# Patient Record
Sex: Male | Born: 1975
Health system: Southern US, Community
[De-identification: ages and names within clinical notes are randomized; demographics above are authoritative.]

## PROBLEM LIST (undated history)

## (undated) DIAGNOSIS — N200 Calculus of kidney: Secondary | ICD-10-CM

## (undated) DIAGNOSIS — G4700 Insomnia, unspecified: Secondary | ICD-10-CM

## (undated) DIAGNOSIS — J3089 Other allergic rhinitis: Secondary | ICD-10-CM

## (undated) DIAGNOSIS — M1990 Unspecified osteoarthritis, unspecified site: Secondary | ICD-10-CM

## (undated) DIAGNOSIS — J302 Other seasonal allergic rhinitis: Secondary | ICD-10-CM

## (undated) DIAGNOSIS — R112 Nausea with vomiting, unspecified: Secondary | ICD-10-CM

## (undated) DIAGNOSIS — R5383 Other fatigue: Secondary | ICD-10-CM

## (undated) DIAGNOSIS — M25511 Pain in right shoulder: Secondary | ICD-10-CM

## (undated) DIAGNOSIS — E782 Mixed hyperlipidemia: Secondary | ICD-10-CM

## (undated) DIAGNOSIS — I10 Essential (primary) hypertension: Secondary | ICD-10-CM

## (undated) DIAGNOSIS — T4145XA Adverse effect of unspecified anesthetic, initial encounter: Secondary | ICD-10-CM

## (undated) DIAGNOSIS — N529 Male erectile dysfunction, unspecified: Secondary | ICD-10-CM

## (undated) DIAGNOSIS — E669 Obesity, unspecified: Secondary | ICD-10-CM

## (undated) DIAGNOSIS — M25512 Pain in left shoulder: Secondary | ICD-10-CM

## (undated) DIAGNOSIS — Z9889 Other specified postprocedural states: Secondary | ICD-10-CM

## (undated) DIAGNOSIS — T8859XA Other complications of anesthesia, initial encounter: Secondary | ICD-10-CM

## (undated) HISTORY — DX: Pain in left shoulder: M25.512

## (undated) HISTORY — PX: CHOLECYSTECTOMY, LAPAROSCOPIC: SHX56

## (undated) HISTORY — DX: Other allergic rhinitis: J30.89

## (undated) HISTORY — PX: CHOLECYSTECTOMY: SHX55

## (undated) HISTORY — DX: Obesity, unspecified: E66.9

## (undated) HISTORY — DX: Other seasonal allergic rhinitis: J30.2

## (undated) HISTORY — DX: Pain in right shoulder: M25.511

## (undated) HISTORY — DX: Male erectile dysfunction, unspecified: N52.9

## (undated) HISTORY — DX: Mixed hyperlipidemia: E78.2

## (undated) HISTORY — DX: Insomnia, unspecified: G47.00

## (undated) HISTORY — DX: Other fatigue: R53.83

## (undated) HISTORY — DX: Calculus of kidney: N20.0

---

## 2005-03-31 HISTORY — PX: CERVICAL LAMINECTOMY: SHX94

## 2005-08-06 ENCOUNTER — Emergency Department (HOSPITAL_COMMUNITY): Admission: EM | Admit: 2005-08-06 | Discharge: 2005-08-06 | Payer: Self-pay | Admitting: *Deleted

## 2005-08-08 ENCOUNTER — Encounter: Payer: Self-pay | Admitting: Interventional Radiology

## 2005-09-15 ENCOUNTER — Ambulatory Visit (HOSPITAL_COMMUNITY): Admission: RE | Admit: 2005-09-15 | Discharge: 2005-09-15 | Payer: Self-pay | Admitting: Interventional Radiology

## 2006-03-03 ENCOUNTER — Ambulatory Visit (HOSPITAL_COMMUNITY): Admission: RE | Admit: 2006-03-03 | Discharge: 2006-03-05 | Payer: Self-pay | Admitting: Orthopedic Surgery

## 2006-03-03 ENCOUNTER — Encounter (INDEPENDENT_AMBULATORY_CARE_PROVIDER_SITE_OTHER): Payer: Self-pay | Admitting: *Deleted

## 2008-09-11 ENCOUNTER — Ambulatory Visit: Payer: Self-pay | Admitting: Cardiology

## 2009-03-12 ENCOUNTER — Emergency Department (HOSPITAL_COMMUNITY): Admission: EM | Admit: 2009-03-12 | Discharge: 2009-03-12 | Payer: Self-pay | Admitting: Emergency Medicine

## 2009-07-15 ENCOUNTER — Emergency Department (HOSPITAL_BASED_OUTPATIENT_CLINIC_OR_DEPARTMENT_OTHER): Admission: EM | Admit: 2009-07-15 | Discharge: 2009-07-15 | Payer: Self-pay | Admitting: Emergency Medicine

## 2009-07-15 ENCOUNTER — Ambulatory Visit: Payer: Self-pay | Admitting: Radiology

## 2009-10-19 ENCOUNTER — Observation Stay (HOSPITAL_COMMUNITY): Admission: EM | Admit: 2009-10-19 | Discharge: 2009-10-21 | Payer: Self-pay | Admitting: Emergency Medicine

## 2009-12-15 ENCOUNTER — Emergency Department (HOSPITAL_COMMUNITY): Admission: EM | Admit: 2009-12-15 | Discharge: 2009-12-15 | Payer: Self-pay | Admitting: Emergency Medicine

## 2010-06-15 LAB — BASIC METABOLIC PANEL
BUN: 12 mg/dL (ref 6–23)
BUN: 16 mg/dL (ref 6–23)
BUN: 22 mg/dL (ref 6–23)
CO2: 26 mEq/L (ref 19–32)
Calcium: 8.6 mg/dL (ref 8.4–10.5)
Chloride: 101 mEq/L (ref 96–112)
Chloride: 102 mEq/L (ref 96–112)
Chloride: 96 mEq/L (ref 96–112)
Creatinine, Ser: 1.03 mg/dL (ref 0.4–1.5)
Creatinine, Ser: 1.05 mg/dL (ref 0.4–1.5)
GFR calc Af Amer: >60 mL/min (ref >60)
GFR calc Af Amer: >60 mL/min (ref >60)
GFR calc non Af Amer: >60 mL/min (ref >60)
Glucose, Bld: 120 mg/dL — ABNORMAL HIGH (ref 70–99)
Potassium: 4.2 mEq/L (ref 3.5–5.1)
Potassium: 4.5 mEq/L (ref 3.5–5.1)
Sodium: 135 mEq/L (ref 135–145)

## 2010-06-15 LAB — CARDIAC PANEL(CRET KIN+CKTOT+MB+TROPI)
CK, MB: 0.7 ng/mL (ref 0.3–4.0)
CK, MB: 0.8 ng/mL (ref 0.3–4.0)
Relative Index: INVALID (ref 0.0–2.5)
Relative Index: INVALID (ref 0.0–2.5)
Troponin I: 0.01 ng/mL (ref 0.00–0.06)
Troponin I: 0.01 ng/mL (ref 0.00–0.06)
Troponin I: <0.01 ng/mL (ref 0.00–0.06)

## 2010-06-15 LAB — POCT CARDIAC MARKERS
CKMB, poc: <1 ng/mL — ABNORMAL LOW (ref 1.0–8.0)
Myoglobin, poc: 65.1 ng/mL (ref 12–200)
Myoglobin, poc: 78.7 ng/mL (ref 12–200)

## 2010-06-15 LAB — LIPID PANEL
HDL: 38 mg/dL — ABNORMAL LOW (ref >39)
LDL Cholesterol: 145 mg/dL — ABNORMAL HIGH (ref 0–99)
Triglycerides: 221 mg/dL — ABNORMAL HIGH (ref <150)
VLDL: 44 mg/dL — ABNORMAL HIGH (ref 0–40)

## 2010-06-15 LAB — MRSA PCR SCREENING: MRSA by PCR: NEGATIVE

## 2010-06-15 LAB — CBC
HCT: 41.1 % (ref 39.0–52.0)
HCT: 45.8 % (ref 39.0–52.0)
Hemoglobin: 14.2 g/dL (ref 13.0–17.0)
MCV: 89.4 fL (ref 78.0–100.0)
MCV: 89.7 fL (ref 78.0–100.0)
RBC: 4.6 MIL/uL (ref 4.22–5.81)
RDW: 11.9 % (ref 11.5–15.5)
WBC: 11.2 10*3/uL — ABNORMAL HIGH (ref 4.0–10.5)
WBC: 9 10*3/uL (ref 4.0–10.5)

## 2010-06-15 LAB — TSH: TSH: 1.879 u[IU]/mL (ref 0.350–4.500)

## 2010-06-15 LAB — DIFFERENTIAL
Basophils Relative: 1 % (ref 0–1)
Lymphs Abs: 2.8 10*3/uL (ref 0.7–4.0)
Monocytes Relative: 7 % (ref 3–12)
Neutro Abs: 7.5 10*3/uL (ref 1.7–7.7)
Neutrophils Relative %: 67 % (ref 43–77)

## 2010-06-15 LAB — RAPID URINE DRUG SCREEN, HOSP PERFORMED
Amphetamines: NOT DETECTED
Benzodiazepines: NOT DETECTED
Opiates: POSITIVE — AB
Tetrahydrocannabinol: NOT DETECTED

## 2010-06-15 LAB — CK TOTAL AND CKMB (NOT AT ARMC): CK, MB: 0.7 ng/mL (ref 0.3–4.0)

## 2010-06-18 LAB — URINALYSIS, ROUTINE W REFLEX MICROSCOPIC
Bilirubin Urine: NEGATIVE
Glucose, UA: NEGATIVE mg/dL
Ketones, ur: NEGATIVE mg/dL
Protein, ur: NEGATIVE mg/dL
pH: 5.5 (ref 5.0–8.0)

## 2010-06-18 LAB — CBC
HCT: 43.6 % (ref 39.0–52.0)
Hemoglobin: 14.6 g/dL (ref 13.0–17.0)
MCHC: 33.3 g/dL (ref 30.0–36.0)
RDW: 11.6 % (ref 11.5–15.5)

## 2010-06-18 LAB — DIFFERENTIAL
Basophils Relative: 1 % (ref 0–1)
Lymphocytes Relative: 25 % (ref 12–46)
Lymphs Abs: 1.8 10*3/uL (ref 0.7–4.0)
Monocytes Relative: 11 % (ref 3–12)
Neutro Abs: 4 10*3/uL (ref 1.7–7.7)
Neutrophils Relative %: 59 % (ref 43–77)

## 2010-06-18 LAB — COMPREHENSIVE METABOLIC PANEL
BUN: 14 mg/dL (ref 6–23)
Calcium: 9.2 mg/dL (ref 8.4–10.5)
Creatinine, Ser: 0.9 mg/dL (ref 0.4–1.5)
Glucose, Bld: 134 mg/dL — ABNORMAL HIGH (ref 70–99)
Total Protein: 7.6 g/dL (ref 6.0–8.3)

## 2010-07-02 LAB — BASIC METABOLIC PANEL
CO2: 23 mEq/L (ref 19–32)
Calcium: 8.9 mg/dL (ref 8.4–10.5)
GFR calc Af Amer: >60 mL/min (ref >60)
Sodium: 137 mEq/L (ref 135–145)

## 2010-07-02 LAB — POCT CARDIAC MARKERS
CKMB, poc: <1 ng/mL — ABNORMAL LOW (ref 1.0–8.0)
CKMB, poc: <1 ng/mL — ABNORMAL LOW (ref 1.0–8.0)
Troponin i, poc: <0.05 ng/mL (ref 0.00–0.09)

## 2010-07-02 LAB — POCT I-STAT, CHEM 8
Chloride: 109 mEq/L (ref 96–112)
Glucose, Bld: 109 mg/dL — ABNORMAL HIGH (ref 70–99)
HCT: 45 % (ref 39.0–52.0)
Potassium: 4 mEq/L (ref 3.5–5.1)

## 2010-07-21 ENCOUNTER — Emergency Department (HOSPITAL_COMMUNITY): Payer: Managed Care, Other (non HMO)

## 2010-07-21 ENCOUNTER — Emergency Department (HOSPITAL_COMMUNITY)
Admission: EM | Admit: 2010-07-21 | Discharge: 2010-07-21 | Disposition: A | Discharge status: Home / Self Care | Payer: Managed Care, Other (non HMO) | Attending: Emergency Medicine | Admitting: Emergency Medicine

## 2010-07-21 DIAGNOSIS — R109 Unspecified abdominal pain: Secondary | ICD-10-CM | POA: Insufficient documentation

## 2010-07-21 DIAGNOSIS — I10 Essential (primary) hypertension: Secondary | ICD-10-CM | POA: Insufficient documentation

## 2010-07-21 DIAGNOSIS — Z87442 Personal history of urinary calculi: Secondary | ICD-10-CM | POA: Insufficient documentation

## 2010-07-21 DIAGNOSIS — Z79899 Other long term (current) drug therapy: Secondary | ICD-10-CM | POA: Insufficient documentation

## 2010-07-21 LAB — URINALYSIS, ROUTINE W REFLEX MICROSCOPIC
Bilirubin Urine: NEGATIVE
Glucose, UA: 500 mg/dL — AB
Hgb urine dipstick: NEGATIVE
Ketones, ur: NEGATIVE mg/dL
Leukocytes, UA: NEGATIVE
Nitrite: NEGATIVE
Specific Gravity, Urine: >1.03 — ABNORMAL HIGH (ref 1.005–1.030)
Urobilinogen, UA: 0.2 mg/dL (ref 0.0–1.0)
pH: 5.5 (ref 5.0–8.0)

## 2010-07-21 LAB — URINE MICROSCOPIC-ADD ON

## 2010-07-21 LAB — GLUCOSE, CAPILLARY: Glucose-Capillary: 151 mg/dL — ABNORMAL HIGH (ref 70–99)

## 2010-08-12 ENCOUNTER — Emergency Department (HOSPITAL_COMMUNITY)
Admission: EM | Admit: 2010-08-12 | Discharge: 2010-08-12 | Disposition: A | Discharge status: Home / Self Care | Payer: Managed Care, Other (non HMO) | Attending: Emergency Medicine | Admitting: Emergency Medicine

## 2010-08-12 ENCOUNTER — Emergency Department (HOSPITAL_COMMUNITY): Payer: Managed Care, Other (non HMO)

## 2010-08-12 DIAGNOSIS — R319 Hematuria, unspecified: Secondary | ICD-10-CM | POA: Insufficient documentation

## 2010-08-12 DIAGNOSIS — I10 Essential (primary) hypertension: Secondary | ICD-10-CM | POA: Insufficient documentation

## 2010-08-12 DIAGNOSIS — M549 Dorsalgia, unspecified: Secondary | ICD-10-CM | POA: Insufficient documentation

## 2010-08-12 DIAGNOSIS — Z87442 Personal history of urinary calculi: Secondary | ICD-10-CM | POA: Insufficient documentation

## 2010-08-12 DIAGNOSIS — R300 Dysuria: Secondary | ICD-10-CM | POA: Insufficient documentation

## 2010-08-12 DIAGNOSIS — R110 Nausea: Secondary | ICD-10-CM | POA: Insufficient documentation

## 2010-08-12 DIAGNOSIS — R109 Unspecified abdominal pain: Secondary | ICD-10-CM | POA: Insufficient documentation

## 2010-08-12 LAB — BASIC METABOLIC PANEL
CO2: 28 mEq/L (ref 19–32)
Calcium: 9.7 mg/dL (ref 8.4–10.5)
Creatinine, Ser: 0.9 mg/dL (ref 0.4–1.5)
GFR calc Af Amer: >60 mL/min (ref >60)
GFR calc non Af Amer: >60 mL/min (ref >60)

## 2010-08-12 LAB — CBC
MCH: 29.1 pg (ref 26.0–34.0)
MCHC: 33.5 g/dL (ref 30.0–36.0)
Platelets: 202 10*3/uL (ref 150–400)
RDW: 12.3 % (ref 11.5–15.5)

## 2010-08-12 LAB — DIFFERENTIAL
Basophils Relative: 1 % (ref 0–1)
Eosinophils Absolute: 0.1 10*3/uL (ref 0.0–0.7)
Eosinophils Relative: 2 % (ref 0–5)
Monocytes Absolute: 0.5 10*3/uL (ref 0.1–1.0)
Monocytes Relative: 6 % (ref 3–12)

## 2010-08-12 LAB — URINALYSIS, ROUTINE W REFLEX MICROSCOPIC
Glucose, UA: 250 mg/dL — AB
Ketones, ur: NEGATIVE mg/dL
Protein, ur: NEGATIVE mg/dL

## 2010-08-15 LAB — URINE CULTURE
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
## 2010-08-16 NOTE — Consult Note (Signed)
NAME:  Michael Gallagher, Michael Gallagher NO.:  [id number]   MEDICAL RECORD NO.:  [id number]          PATIENT TYPE:  OUT   LOCATION:  XRAY                         FACILITY:  [facility]   PHYSICIAN:  Sanjeev K. Deveshwar, M.D.DATE OF BIRTH:  25-Jul-1975   DATE OF CONSULTATION:  08/08/2005  DATE OF DISCHARGE:                                   CONSULTATION   BRIEF ADDENDUM:  Further history on this patient reveals that he is a weight  lifter for recreation.  It has been recommended that he not participate  further  in any type of weight lifting and that he avoid lifting more than  25 pounds until this aneurysm is further evaluated and treated.  It was also  recommended that he keep his blood pressure well controlled to further  decrease his risk of aneurysm rupture.      Delton See, P.A.    ______________________________  Grandville Silos. Corliss Skains, M.D.    DR/MEDQ  D:  08/08/2005  T:  08/09/2005  Job:  [id number]

## 2010-08-16 NOTE — Op Note (Signed)
NAME:  Michael Gallagher, Michael Gallagher NO.:  [id number]   MEDICAL RECORD NO.:  [id number]          PATIENT TYPE:  OIB   LOCATION:  3009                         FACILITY:  [facility]   PHYSICIAN:  Nelda Severe, MD      DATE OF BIRTH:  1975-05-07   DATE OF PROCEDURE:  03/03/2006  DATE OF DISCHARGE:                               OPERATIVE REPORT   SURGEON:  Dr. Theadore Nan.   ASSISTANT:  Lianne Cure, PA-C.   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   OPERATIVE PROCEDURE:  Anterior C5-6 decompression and fusion with  Synthes cage and beta tricalcium phosphate and aspiration bone marrow  the C5 vertebral body, application of titanium screw and plate  (Synthes).   OPERATIVE NOTE:  The patient was placed under general endotracheal  anesthesia.  A gram of vancomycin had been infused intravenously.  Sequential compression devices were placed on both lower extremities.  The neck was extended and head supported on a [place]field horseshoe type  head holder.   The anterior cervical spine was prepped with DuraPrep and draped in  square fashion.  The drapes were secured with Ioban.   A transverse incision was made from midline to the medial border the  sternocleidomastoid on the left side at what was estimated to be the C5-  6 level.  Platysma was cut in line with the incision.  There was a good  deal of fat deep to the platysma and eventually the anterior border of  the sternocleidomastoid was identified and the interval entering the  deep cervical fascia identified.  Blunt dissection was carried out down  to the anterior aspect of the spinal column.  The omohyoid muscle was  identified and retracted distally and medially.   We identified one disk space higher than that which was anticipated to  be the correct disk space so that the marker would be more likely to  show up on x-ray.  Radiograph showed the marker (a  bent spinal needle,  at the C4-5 level, hence identifying the C5 vertebra and the C5-6 disk  space by counting down one level).   We mobilized the longus coli muscles bilaterally from the C4-5 disk to  the C6-7 disk.  Shadow Line retractor was placed with the deepest  available blade placed to the right side (60 mm) and a 50 mm blade  laterally.  Both plates were placed deep to the longus coli muscle and  the muscle retracted bilaterally.  We then placed distraction pins in  the body of C5 and C6 and attached the Synthes distracting device and  distracted the disk space several millimeters.  We then made a  rectangular cut in the anterior disk and then separated as much of the  nucleus as possible from the endplates using small elevators.  The disk  was then removed about 75% in one piece and then straight, forward  angled, back angled curettes used to remove disk material back as far as  the posterior margin of the C6 vertebral body and C5 vertebral body  above.  A 1-mm Kerrison rongeur was used to remove some of the posterior  lip of the C6 vertebral body.  Prior to that, a very small amount of  high speed burring was done very far posteriorly.  Eventually it was  possible to remove the posterior longitudinal ligament across the entire  breadth of the disk and we removed more bone above and below out into  the neural foramen.  It was then possible to probe the neural foramen  until no compression whatsoever, right side.  Because the patient had no  left arm pain and no evidence of a left-sided disk herniation, formal  foraminotomy was not carried out on the left side.   We then took care to curette away any residual cartilage on the endplate  above and below.  We then used the convex cage trials, having removed  the distraction apparatus.  It appeared the 6 mm trial was the proper  thickness.   Prior to this we had aspirated 5 mL of bone marrow from the C6 vertebral  body using an  18 gauge spinal needle and this was mixed with 5 mL of  Vitoss, beta tricalcium phosphate.   The cage was packed with beta tricalcium phosphate and then gently  impacted into place, after we had again distracted the disk space.  The  distraction was then let off and the distraction pins removed.   We then chose the appropriate length one level titanium plate which was  attached with self-drilling screws.  Each screw head was carefully  seated and tightened.   More Vitoss was packed in deep to the plate and anterior to the cage to  the fenestration in the front of the plate.   Cross-table lateral radiograph was taken and confirmed the position of  the screws at C5 and therefore confirmed that we were at the correct  level.  Because of the patient's very large size and large shoulders, we  could not see the C6 vertebra.   Prior to placing the Vitoss we irrigated the wound.  Gelfoam was then  placed over the plate anteriorly.  A one-quarter inch Penrose drain was  placed in the depths of the wound.  The wound was closed using 0 Vicryl  suture interrupted fashion in the platysma.  The subcutaneous layer was  closed using inverted 2-0 Vicryl suture.  The skin was closed using  running subcuticular undyed 3-0 Vicryl suture.  The drain was secured  with a single 4-0 nylon suture.  The skin edges were reinforced with  Steri-Strips.  A nonadherent antibiotic ointment dressing was applied  with simple gauze.  The gauze was secured with a surgical towel around  the neck.   There were no intraoperative complications.  The sponge, needle counts  were correct.  The patient at this time has not awakened so no  neurological examination is reported here.      Nelda Severe, MD  Electronically Signed     MT/MEDQ  D:  03/03/2006  T:  03/04/2006  Job:  [id number]

## 2010-08-16 NOTE — H&P (Signed)
NAME:  Michael Gallagher, Michael Gallagher NO.:  [id number]   MEDICAL RECORD NO.:  [id number]          PATIENT TYPE:  AMB   LOCATION:  SDS                          FACILITY:  [facility]   PHYSICIAN:  Nelda Severe, MD      DATE OF BIRTH:  May 23, 1975   DATE OF ADMISSION:  DATE OF DISCHARGE:                              HISTORY & PHYSICAL   Mr. Michael Gallagher comes in with a chief complaint of neck pain and decreased  sensation in the right upper extremity that runs down into his thumb and  index finger and he has a weak right grip.   DRUG ALLERGIES:  NO KNOWN DRUG ALLERGIES.   MEDICATIONS TO DATE:  Lopressor 50 mg in the a.m. and 25 mg in the p.m.  He takes Vicodin 5/325 one-two q.6 p.r.n. for pain control currently and  he takes Benadryl 25-50 mg at night for sleep assistance.   PAST HISTORY:  He has had an angiogram September 15, 2005 and he has  hypertension.   FAMILY HISTORY:  His father has heart disease, hypertension, diabetes  and gout and kidney disease.   REVIEW OF SYSTEMS:  He reports no change in vision or hearing.  No  hoarseness.  No nose bleeds.  No difficulty swallowing.  No cough.  No  chest pain.  No nausea, vomiting or diarrhea.  No bowel or bladder  incontinence.  No frequent new onset weight changes.  No seizures.  He  does have frequent headaches.  No depression.  No nervous tendencies.   PHYSICAL EXAMINATION:  VITAL SIGNS:  His temperature is 98.7.  Pulse was  70.  Respirations 20.  Blood pressure is 144/90.  GENERAL:  He appears healthy.  He is very slightly overweight.  HEENT:  His pupils are equal, round and reactive to light.  NECK:  Tender to palpation at the area of C5-6.  He has a very guarded  posture.  Decreased range of motion about 50% rotation right and left  flexion extension.  CHEST:  Clear to auscultation bilaterally.  No wheezes noted.  HEART:  Regular rate and rhythm.  No murmurs noted.  ABDOMEN:  Soft, nontender to palpation.  Positive bowel sounds on  auscultation.  EXTREMITIES:  Right upper extremity numbness that radiates from the neck  down the arm and to the thumb and index finger.  SKIN:  Clean, dry and intact.   MRI was reviewed and showed C5-6 disc herniation to the right.   IMPRESSION:  C5-6 herniated nucleus pulposae with radicular right arm  pain.   PLAN:  Anterior C5-6 fusion, decompression by Dr. Nelda Severe.      Lianne Cure, P.A.      Nelda Severe, MD  Electronically Signed    MC/MEDQ  D:  02/27/2006  T:  02/28/2006  Job:  [id number]
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
## 2011-04-30 ENCOUNTER — Emergency Department (HOSPITAL_COMMUNITY)
Admission: EM | Admit: 2011-04-30 | Discharge: 2011-04-30 | Disposition: A | Discharge status: Home / Self Care | Payer: Managed Care, Other (non HMO) | Attending: Emergency Medicine | Admitting: Emergency Medicine

## 2011-04-30 ENCOUNTER — Emergency Department (HOSPITAL_COMMUNITY): Payer: Managed Care, Other (non HMO)

## 2011-04-30 ENCOUNTER — Other Ambulatory Visit: Payer: Self-pay

## 2011-04-30 ENCOUNTER — Encounter (HOSPITAL_COMMUNITY): Payer: Self-pay | Admitting: *Deleted

## 2011-04-30 DIAGNOSIS — R110 Nausea: Secondary | ICD-10-CM | POA: Insufficient documentation

## 2011-04-30 DIAGNOSIS — R072 Precordial pain: Secondary | ICD-10-CM | POA: Insufficient documentation

## 2011-04-30 DIAGNOSIS — I10 Essential (primary) hypertension: Secondary | ICD-10-CM | POA: Insufficient documentation

## 2011-04-30 DIAGNOSIS — R0602 Shortness of breath: Secondary | ICD-10-CM | POA: Insufficient documentation

## 2011-04-30 DIAGNOSIS — R42 Dizziness and giddiness: Secondary | ICD-10-CM | POA: Insufficient documentation

## 2011-04-30 HISTORY — DX: Essential (primary) hypertension: I10

## 2011-04-30 LAB — BASIC METABOLIC PANEL
CO2: 22 mEq/L (ref 19–32)
Calcium: 8.7 mg/dL (ref 8.4–10.5)
Glucose, Bld: 162 mg/dL — ABNORMAL HIGH (ref 70–99)
Potassium: 3.8 mEq/L (ref 3.5–5.1)
Sodium: 136 mEq/L (ref 135–145)

## 2011-04-30 LAB — CBC
Hemoglobin: 14.6 g/dL (ref 13.0–17.0)
MCH: 29.7 pg (ref 26.0–34.0)
RBC: 4.91 MIL/uL (ref 4.22–5.81)

## 2011-04-30 LAB — CARDIAC PANEL(CRET KIN+CKTOT+MB+TROPI)
CK, MB: 1.6 ng/mL (ref 0.3–4.0)
Troponin I: <0.3 ng/mL (ref <0.30)

## 2011-04-30 LAB — DIFFERENTIAL
Basophils Absolute: 0.1 10*3/uL (ref 0.0–0.1)
Basophils Relative: 1 % (ref 0–1)
Neutro Abs: 4.9 10*3/uL (ref 1.7–7.7)
Neutrophils Relative %: 59 % (ref 43–77)

## 2011-04-30 LAB — TROPONIN I: Troponin I: <0.3 ng/mL (ref <0.30)

## 2011-04-30 MED ORDER — MORPHINE SULFATE 4 MG/ML IJ SOLN
4.0000 mg | Freq: Once | INTRAMUSCULAR | Status: AC
  Administered 2011-04-30: 4 mg via INTRAVENOUS
  Filled 2011-04-30: qty 1

## 2011-04-30 MED ORDER — ACETAMINOPHEN 325 MG PO TABS
650.0000 mg | ORAL_TABLET | Freq: Once | ORAL | Status: AC
  Administered 2011-04-30: 650 mg via ORAL

## 2011-04-30 MED ORDER — NITROGLYCERIN IN D5W 200-5 MCG/ML-% IV SOLN
5.0000 ug/min | INTRAVENOUS | Status: DC
  Administered 2011-04-30: 5 ug/min via INTRAVENOUS
  Filled 2011-04-30: qty 250

## 2011-04-30 MED ORDER — ACETAMINOPHEN 325 MG PO TABS
ORAL_TABLET | ORAL | Status: AC
  Filled 2011-04-30: qty 2

## 2011-04-30 MED ORDER — ONDANSETRON HCL 4 MG/2ML IJ SOLN
4.0000 mg | Freq: Once | INTRAMUSCULAR | Status: AC
  Administered 2011-04-30: 4 mg via INTRAVENOUS
  Filled 2011-04-30: qty 2

## 2011-04-30 NOTE — ED Provider Notes (Signed)
History     CSN: [id number]  Arrival date & time 04/30/11  1329   First MD Initiated Contact with Patient 04/30/11 1410      Chief Complaint  Patient presents with  . Chest Pain    midsternal, nonradiating    (Consider location/radiation/quality/duration/timing/severity/associated sxs/prior treatment) HPI Comments: Patient presents for further evaluation of sudden onset of mid sternal chest pain which radiates into his left chest in addition to shortness of breath and mild nausea. He had just sat down to lunch today when his symptoms began. He also describes a feeling of lightheadedness, which has since resolved.   He has a history of hypertension for which he takes metoprolol daily. He also describes previous episodes of somewhat similar chest pain for which she was evaluated approximately 2 years ago by Dr. Donnie Aho, reporting his exercise stress test and lab work at that time including cholesterol evaluation were negative. Family history is strongly positive for cardiac disease, father with coronary disease who is recently deceased, with cardiac symptoms starting in his mid 53s. He reports his older brother who is currently 50 years old and had bypass surgery about 5 years ago. Patient has received 2 sublingual nitroglycerin tablets and aspirin 324 mg prior to arrival by EMS. He reports his pain has been improved from an 8/10-4/10 after this treatment.  Patient is a 36 y.o. male presenting with chest pain. The history is provided by the patient.  Chest Pain The chest pain began 1 - 2 hours ago. Primary symptoms include shortness of breath and nausea. Pertinent negatives for primary symptoms include no fever, no abdominal pain and no dizziness.  Pertinent negatives for associated symptoms include no numbness and no weakness. He tried aspirin and nitroglycerin for the symptoms.     Past Medical History  Diagnosis Date  . Hypertension     History reviewed. No pertinent past surgical  history.  History reviewed. No pertinent family history.  History  Substance Use Topics  . Smoking status: Never Smoker   . Smokeless tobacco: Not on file  . Alcohol Use: No      Review of Systems  Constitutional: Negative for fever.  HENT: Negative for congestion, sore throat and neck pain.   Eyes: Negative.   Respiratory: Positive for shortness of breath. Negative for chest tightness.   Cardiovascular: Positive for chest pain.  Gastrointestinal: Positive for nausea. Negative for abdominal pain.  Genitourinary: Negative.   Musculoskeletal: Negative for joint swelling and arthralgias.  Skin: Negative.  Negative for rash and wound.  Neurological: Negative for dizziness, weakness, light-headedness, numbness and headaches.  Hematological: Negative.   Psychiatric/Behavioral: Negative.     Allergies  Review of patient's allergies indicates no known allergies.  Home Medications   Current Outpatient Rx  Name Route Sig Dispense Refill  . ACETAMINOPHEN 325 MG PO TABS Oral Take 650 mg by mouth every 6 (six) hours as needed. For pain    . IBUPROFEN 200 MG PO TABS Oral Take 200 mg by mouth every 6 (six) hours as needed. For pain    . METOPROLOL TARTRATE 50 MG PO TABS Oral Take 50 mg by mouth 2 (two) times daily.      BP 122/66  Pulse 88  Temp(Src) 97.4 F (36.3 C) (Oral)  Resp 16  SpO2 100%  Physical Exam  Nursing note and vitals reviewed. Constitutional: He is oriented to person, place, and time. He appears well-developed and well-nourished.  HENT:  Head: Normocephalic and atraumatic.  Eyes: Conjunctivae are  normal.  Neck: Normal range of motion.  Cardiovascular: Normal rate, regular rhythm, normal heart sounds and intact distal pulses.   Pulmonary/Chest: Effort normal and breath sounds normal. He has no wheezes. He exhibits no tenderness.  Abdominal: Soft. Bowel sounds are normal. There is no tenderness.  Musculoskeletal: Normal range of motion. He exhibits no edema and  no tenderness.       tenderness, ankle edema, no palpable cords.  Neurological: He is alert and oriented to person, place, and time.  Skin: Skin is warm and dry. He is not diaphoretic.  Psychiatric: He has a normal mood and affect.    ED Course  Procedures (including critical care time)   Labs Reviewed  CBC  BASIC METABOLIC PANEL  CARDIAC PANEL(CRET KIN+CKTOT+MB+TROPI)  DIFFERENTIAL   No results found.   No diagnosis found.    MDM  Review of chart reveals pt was initially evaluated by Julian in consult when inpatient in 2011,  But outpatient f/u has been with Dr Donnie Aho.  Results of workup - treadmill including chemical stress was negative,  Elevated cholesterol panel (not currently treated for this).    Discussed pt with Dr Rubin Payor.  Will call for cardiac consult once 3 hour troponin is back.       Date: 04/30/2011  Rate: 93  Rhythm: normal sinus rhythm  QRS Axis: normal  Intervals: normal  ST/T Wave abnormalities: normal  Conduction Disutrbances:none  Narrative Interpretation:   Old EKG Reviewed: unchanged     Candis Musa, PA 04/30/11 1657

## 2011-04-30 NOTE — ED Provider Notes (Signed)
Medical screening examination/treatment/procedure(s) were performed by non-physician practitioner and as supervising physician I was immediately available for consultation/collaboration.  Flint Melter, MD 04/30/11 2029

## 2011-04-30 NOTE — ED Notes (Signed)
Patient sat down to eat today and started experiencing sharp chest pain, mid-sternal.  Patient with hx of HTN, patient with nausea associated.

## 2011-04-30 NOTE — ED Notes (Signed)
Patient with EKG completed in hallway per tech.

## 2011-04-30 NOTE — ED Notes (Signed)
Portable xray in dept 

## 2011-04-30 NOTE — ED Notes (Signed)
Patient received 2 nitro tabs sublingual PTA, patient states pain decreased from 8/10 to 5/10.

## 2011-05-01 ENCOUNTER — Encounter: Payer: Self-pay | Admitting: Cardiology

## 2011-05-01 DIAGNOSIS — Z8249 Family history of ischemic heart disease and other diseases of the circulatory system: Secondary | ICD-10-CM | POA: Insufficient documentation

## 2011-05-01 DIAGNOSIS — I10 Essential (primary) hypertension: Secondary | ICD-10-CM | POA: Insufficient documentation

## 2011-05-01 DIAGNOSIS — M5090 Cervical disc disorder, unspecified, unspecified cervical region: Secondary | ICD-10-CM | POA: Insufficient documentation

## 2011-06-10 ENCOUNTER — Other Ambulatory Visit: Payer: Self-pay | Admitting: Orthopedic Surgery

## 2011-06-10 DIAGNOSIS — M5412 Radiculopathy, cervical region: Secondary | ICD-10-CM

## 2011-06-11 ENCOUNTER — Ambulatory Visit
Admission: RE | Admit: 2011-06-11 | Discharge: 2011-06-11 | Disposition: A | Discharge status: Home / Self Care | Payer: Managed Care, Other (non HMO) | Source: Ambulatory Visit | Attending: Orthopedic Surgery | Admitting: Orthopedic Surgery

## 2011-06-11 DIAGNOSIS — M5412 Radiculopathy, cervical region: Secondary | ICD-10-CM

## 2011-06-17 ENCOUNTER — Other Ambulatory Visit: Payer: Self-pay | Admitting: Orthopedic Surgery

## 2011-06-20 ENCOUNTER — Encounter (HOSPITAL_COMMUNITY): Payer: Self-pay | Admitting: Pharmacy Technician

## 2011-06-23 ENCOUNTER — Encounter (HOSPITAL_COMMUNITY): Payer: Self-pay

## 2011-06-23 ENCOUNTER — Encounter (HOSPITAL_COMMUNITY)
Admission: RE | Admit: 2011-06-23 | Discharge: 2011-06-23 | Disposition: A | Discharge status: Home / Self Care | Payer: Managed Care, Other (non HMO) | Source: Ambulatory Visit | Attending: Orthopedic Surgery | Admitting: Orthopedic Surgery

## 2011-06-23 ENCOUNTER — Ambulatory Visit (HOSPITAL_COMMUNITY)
Admission: RE | Admit: 2011-06-23 | Discharge: 2011-06-23 | Disposition: A | Discharge status: Home / Self Care | Payer: Managed Care, Other (non HMO) | Source: Ambulatory Visit | Attending: Orthopedic Surgery | Admitting: Orthopedic Surgery

## 2011-06-23 DIAGNOSIS — I10 Essential (primary) hypertension: Secondary | ICD-10-CM | POA: Insufficient documentation

## 2011-06-23 DIAGNOSIS — Z01818 Encounter for other preprocedural examination: Secondary | ICD-10-CM | POA: Insufficient documentation

## 2011-06-23 HISTORY — DX: Nausea with vomiting, unspecified: R11.2

## 2011-06-23 HISTORY — DX: Adverse effect of unspecified anesthetic, initial encounter: T41.45XA

## 2011-06-23 HISTORY — DX: Unspecified osteoarthritis, unspecified site: M19.90

## 2011-06-23 HISTORY — DX: Other specified postprocedural states: Z98.890

## 2011-06-23 HISTORY — DX: Other complications of anesthesia, initial encounter: T88.59XA

## 2011-06-23 LAB — DIFFERENTIAL
Basophils Relative: 1 % (ref 0–1)
Lymphs Abs: 2.8 10*3/uL (ref 0.7–4.0)
Monocytes Relative: 9 % (ref 3–12)
Neutro Abs: 5.2 10*3/uL (ref 1.7–7.7)
Neutrophils Relative %: 57 % (ref 43–77)

## 2011-06-23 LAB — TYPE AND SCREEN: ABO/RH(D): O POS

## 2011-06-23 LAB — PROTIME-INR
INR: 1.09 (ref 0.00–1.49)
Prothrombin Time: 14.3 seconds (ref 11.6–15.2)

## 2011-06-23 LAB — CBC
HCT: 43.3 % (ref 39.0–52.0)
MCH: 29.4 pg (ref 26.0–34.0)
MCHC: 34.4 g/dL (ref 30.0–36.0)
MCV: 85.4 fL (ref 78.0–100.0)
RDW: 12.2 % (ref 11.5–15.5)

## 2011-06-23 LAB — URINALYSIS, ROUTINE W REFLEX MICROSCOPIC
Bilirubin Urine: NEGATIVE
Glucose, UA: NEGATIVE mg/dL
Ketones, ur: NEGATIVE mg/dL
Leukocytes, UA: NEGATIVE
Protein, ur: NEGATIVE mg/dL

## 2011-06-23 LAB — COMPREHENSIVE METABOLIC PANEL
Albumin: 4.3 g/dL (ref 3.5–5.2)
BUN: 12 mg/dL (ref 6–23)
Creatinine, Ser: 0.87 mg/dL (ref 0.50–1.35)
Total Protein: 7.6 g/dL (ref 6.0–8.3)

## 2011-06-23 NOTE — Progress Notes (Signed)
Chart to be reviewed by A. Zelenak,PAC due to Dr. York Spaniel note remarks that pt. should be seen in one yr. , which would be 05/2010.  Pt. Has not f/u'd with cardiologist.

## 2011-06-23 NOTE — Pre-Procedure Instructions (Signed)
22 Michael Gallagher  06/23/2011   Your procedure is scheduled on:  06/26/2011  Report to [facility] at 11:30 AM.  Call this number if you have problems the morning of surgery: [phone]   Remember:   Do not eat food:After Midnight.  May have clear liquids: up to 4 Hours before arrival.  Clear liquids include soda, tea, black coffee, apple or grape juice, broth.  Take these medicines the morning of surgery with A SIP OF WATER:  METOPRLOL   Do not wear jewelry, make-up or nail polish.  Do not wear lotions, powders, or perfumes. You may wear deodorant.  Do not shave 48 hours prior to surgery.  Do not bring valuables to the hospital.  Contacts, dentures or bridgework may not be worn into surgery.  Leave suitcase in the car. After surgery it may be brought to your room.  For patients admitted to the hospital, checkout time is 11:00 AM the day of discharge.   Patients discharged the day of surgery will not be allowed to drive home.  Name and phone number of your driver:  With wife  Special Instructions: CHG Shower Use Special Wash: 1/2 bottle night before surgery and 1/2 bottle morning of surgery.   Please read over the following fact sheets that you were given: Pain Booklet, Coughing and Deep Breathing, Blood Transfusion Information, MRSA Information and Surgical Site Infection Prevention

## 2011-06-24 NOTE — Consult Note (Signed)
Anesthesia:  Patient is a 36 year old male scheduled for a C6-7 ACDF on 06/26/11.  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  He saw Dr. Donnie Aho on 05/18/09 for f/u HTN/HLD.  He denied CP at that time, but apparently he had atypical chest pain at one time and did have a stress and echo done in January 2011.  By Dr. York Spaniel notes, he has a negative adequate treadmill test and had no evidence of myocardial ischemia.  His echo on 04/04/09 showed concentric LVH with normal systolic function, trace MR.  EF 55%.  He actually had another stress test on 10/21/09 here at [facility] that showed: No definite stress induced ischemia. Inferolateral attenuation is likely artifactual. No perfusion  defects. Normal wall motion.  EF 66%.  EKG from 04/30/11 showed NSR, low voltage QRS, non-specific ST/T wave abnormality.  I think it appears stable since his last visit with Dr.Tilley.  CXR on 06/23/11 showed no active cardiopulmonary process.  Labs noted.  If no new CV symptoms, then plan to proceed.

## 2011-06-25 MED ORDER — CEFAZOLIN SODIUM-DEXTROSE 2-3 GM-% IV SOLR
2.0000 g | INTRAVENOUS | Status: AC
  Administered 2011-06-26: 2 g via INTRAVENOUS
  Filled 2011-06-25: qty 50

## 2011-06-25 NOTE — Progress Notes (Signed)
Notified pt. of time change. Instructed him to be here at 0530 am on 06/26/11.

## 2011-06-26 ENCOUNTER — Encounter (HOSPITAL_COMMUNITY): Payer: Self-pay | Admitting: Vascular Surgery

## 2011-06-26 ENCOUNTER — Encounter (HOSPITAL_COMMUNITY)
Admission: RE | Disposition: A | Discharge status: Home / Self Care | Payer: Self-pay | Source: Ambulatory Visit | Attending: Orthopedic Surgery

## 2011-06-26 ENCOUNTER — Ambulatory Visit (HOSPITAL_COMMUNITY): Payer: Managed Care, Other (non HMO)

## 2011-06-26 ENCOUNTER — Ambulatory Visit (HOSPITAL_COMMUNITY)
Admission: RE | Admit: 2011-06-26 | Discharge: 2011-06-27 | Disposition: A | Discharge status: Home / Self Care | Payer: Managed Care, Other (non HMO) | Source: Ambulatory Visit | Attending: Orthopedic Surgery | Admitting: Orthopedic Surgery

## 2011-06-26 ENCOUNTER — Ambulatory Visit (HOSPITAL_COMMUNITY): Payer: Managed Care, Other (non HMO) | Admitting: Vascular Surgery

## 2011-06-26 DIAGNOSIS — M5030 Other cervical disc degeneration, unspecified cervical region: Secondary | ICD-10-CM | POA: Insufficient documentation

## 2011-06-26 DIAGNOSIS — Z472 Encounter for removal of internal fixation device: Secondary | ICD-10-CM | POA: Insufficient documentation

## 2011-06-26 DIAGNOSIS — M5090 Cervical disc disorder, unspecified, unspecified cervical region: Secondary | ICD-10-CM

## 2011-06-26 DIAGNOSIS — M129 Arthropathy, unspecified: Secondary | ICD-10-CM | POA: Insufficient documentation

## 2011-06-26 DIAGNOSIS — E119 Type 2 diabetes mellitus without complications: Secondary | ICD-10-CM | POA: Insufficient documentation

## 2011-06-26 DIAGNOSIS — Z981 Arthrodesis status: Secondary | ICD-10-CM | POA: Insufficient documentation

## 2011-06-26 DIAGNOSIS — I10 Essential (primary) hypertension: Secondary | ICD-10-CM | POA: Insufficient documentation

## 2011-06-26 HISTORY — PX: ANTERIOR CERVICAL DECOMP/DISCECTOMY FUSION: SHX1161

## 2011-06-26 SURGERY — ANTERIOR CERVICAL DECOMPRESSION/DISCECTOMY FUSION 1 LEVEL/HARDWARE REMOVAL
Anesthesia: General | Site: Spine Cervical | Laterality: Left | Wound class: Clean

## 2011-06-26 MED ORDER — ROCURONIUM BROMIDE 100 MG/10ML IV SOLN
INTRAVENOUS | Status: DC | PRN
  Administered 2011-06-26: 50 mg via INTRAVENOUS

## 2011-06-26 MED ORDER — PHENOL 1.4 % MT LIQD
1.0000 | OROMUCOSAL | Status: DC | PRN
  Administered 2011-06-26: 1 via OROMUCOSAL
  Filled 2011-06-26: qty 177

## 2011-06-26 MED ORDER — ONDANSETRON HCL 4 MG/2ML IJ SOLN
INTRAMUSCULAR | Status: DC | PRN
  Administered 2011-06-26 (×2): 4 mg via INTRAVENOUS

## 2011-06-26 MED ORDER — POVIDONE-IODINE 7.5 % EX SOLN: Freq: Once | CUTANEOUS | Status: DC

## 2011-06-26 MED ORDER — ACETAMINOPHEN 650 MG RE SUPP: 650.0000 mg | RECTAL | Status: DC | PRN

## 2011-06-26 MED ORDER — ACETAMINOPHEN 325 MG PO TABS: 650.0000 mg | ORAL_TABLET | ORAL | Status: DC | PRN

## 2011-06-26 MED ORDER — POTASSIUM CHLORIDE IN NACL 20-0.9 MEQ/L-% IV SOLN
INTRAVENOUS | Status: DC
  Filled 2011-06-26 (×3): qty 1000

## 2011-06-26 MED ORDER — THROMBIN 20000 UNITS EX KIT
PACK | CUTANEOUS | Status: DC | PRN
  Administered 2011-06-26: 08:00:00 via TOPICAL

## 2011-06-26 MED ORDER — FENTANYL CITRATE 0.05 MG/ML IJ SOLN
INTRAMUSCULAR | Status: DC | PRN
  Administered 2011-06-26: 50 ug via INTRAVENOUS
  Administered 2011-06-26: 125 ug via INTRAVENOUS
  Administered 2011-06-26: 75 ug via INTRAVENOUS

## 2011-06-26 MED ORDER — LISINOPRIL 10 MG PO TABS
10.0000 mg | ORAL_TABLET | Freq: Every morning | ORAL | Status: DC
  Administered 2011-06-26: 10 mg via ORAL
  Filled 2011-06-26 (×2): qty 1

## 2011-06-26 MED ORDER — ZOLPIDEM TARTRATE 5 MG PO TABS: 5.0000 mg | ORAL_TABLET | Freq: Every evening | ORAL | Status: DC | PRN

## 2011-06-26 MED ORDER — SODIUM CHLORIDE 0.9 % IJ SOLN
3.0000 mL | INTRAMUSCULAR | Status: DC | PRN
  Administered 2011-06-26: 3 mL via INTRAVENOUS

## 2011-06-26 MED ORDER — 0.9 % SODIUM CHLORIDE (POUR BTL) OPTIME
TOPICAL | Status: DC | PRN
  Administered 2011-06-26: 1000 mL

## 2011-06-26 MED ORDER — MENTHOL 3 MG MT LOZG: 1.0000 | LOZENGE | OROMUCOSAL | Status: DC | PRN

## 2011-06-26 MED ORDER — MIDAZOLAM HCL 5 MG/5ML IJ SOLN
INTRAMUSCULAR | Status: DC | PRN
  Administered 2011-06-26: 2 mg via INTRAVENOUS

## 2011-06-26 MED ORDER — DOCUSATE SODIUM 100 MG PO CAPS
100.0000 mg | ORAL_CAPSULE | Freq: Two times a day (BID) | ORAL | Status: DC
  Administered 2011-06-26: 100 mg via ORAL
  Filled 2011-06-26: qty 1

## 2011-06-26 MED ORDER — DIAZEPAM 5 MG PO TABS
5.0000 mg | ORAL_TABLET | Freq: Four times a day (QID) | ORAL | Status: DC | PRN
  Administered 2011-06-26 – 2011-06-27 (×3): 5 mg via ORAL
  Filled 2011-06-26 (×3): qty 1

## 2011-06-26 MED ORDER — ONDANSETRON HCL 4 MG/2ML IJ SOLN: 4.0000 mg | INTRAMUSCULAR | Status: DC | PRN

## 2011-06-26 MED ORDER — DROPERIDOL 2.5 MG/ML IJ SOLN
INTRAMUSCULAR | Status: DC | PRN
  Administered 2011-06-26: 0.625 mg via INTRAVENOUS

## 2011-06-26 MED ORDER — OXYCODONE-ACETAMINOPHEN 5-325 MG PO TABS
1.0000 | ORAL_TABLET | ORAL | Status: DC | PRN
  Administered 2011-06-26 – 2011-06-27 (×3): 2 via ORAL
  Filled 2011-06-26 (×3): qty 2

## 2011-06-26 MED ORDER — HYDROMORPHONE HCL PF 1 MG/ML IJ SOLN
INTRAMUSCULAR | Status: AC
  Filled 2011-06-26: qty 1

## 2011-06-26 MED ORDER — LACTATED RINGERS IV SOLN
INTRAVENOUS | Status: DC | PRN
  Administered 2011-06-26 (×2): via INTRAVENOUS

## 2011-06-26 MED ORDER — PROPOFOL 10 MG/ML IV EMUL
INTRAVENOUS | Status: DC | PRN
  Administered 2011-06-26 (×2): 50 mg via INTRAVENOUS
  Administered 2011-06-26: 100 mg via INTRAVENOUS
  Administered 2011-06-26: 300 mg via INTRAVENOUS

## 2011-06-26 MED ORDER — BUPIVACAINE-EPINEPHRINE 0.25% -1:200000 IJ SOLN
INTRAMUSCULAR | Status: DC | PRN
  Administered 2011-06-26: 2.5 mL

## 2011-06-26 MED ORDER — ALUM & MAG HYDROXIDE-SIMETH 200-200-20 MG/5ML PO SUSP: 30.0000 mL | Freq: Four times a day (QID) | ORAL | Status: DC | PRN

## 2011-06-26 MED ORDER — SENNA 8.6 MG PO TABS
1.0000 | ORAL_TABLET | Freq: Two times a day (BID) | ORAL | Status: DC
  Filled 2011-06-26 (×2): qty 1

## 2011-06-26 MED ORDER — ONDANSETRON HCL 4 MG/2ML IJ SOLN: 4.0000 mg | Freq: Once | INTRAMUSCULAR | Status: DC | PRN

## 2011-06-26 MED ORDER — METOPROLOL TARTRATE 50 MG PO TABS
50.0000 mg | ORAL_TABLET | Freq: Two times a day (BID) | ORAL | Status: DC
  Filled 2011-06-26 (×3): qty 1

## 2011-06-26 MED ORDER — SODIUM CHLORIDE 0.9 % IJ SOLN
3.0000 mL | Freq: Two times a day (BID) | INTRAMUSCULAR | Status: DC
  Administered 2011-06-27: 3 mL via INTRAVENOUS

## 2011-06-26 MED ORDER — CEFAZOLIN SODIUM 1-5 GM-% IV SOLN
1.0000 g | Freq: Three times a day (TID) | INTRAVENOUS | Status: AC
  Administered 2011-06-26 – 2011-06-27 (×2): 1 g via INTRAVENOUS
  Filled 2011-06-26 (×3): qty 50

## 2011-06-26 MED ORDER — HYDROMORPHONE HCL PF 1 MG/ML IJ SOLN
0.2500 mg | INTRAMUSCULAR | Status: DC | PRN
Start: 2011-06-26 — End: 2011-06-26
  Administered 2011-06-26 (×2): 0.5 mg via INTRAVENOUS
  Administered 2011-06-26 (×4): 0.25 mg via INTRAVENOUS

## 2011-06-26 MED ORDER — MORPHINE SULFATE 4 MG/ML IJ SOLN
2.0000 mg | INTRAMUSCULAR | Status: DC | PRN
  Administered 2011-06-26 – 2011-06-27 (×2): 2 mg via INTRAVENOUS
  Filled 2011-06-26 (×2): qty 1

## 2011-06-26 SURGICAL SUPPLY — 80 items
APL SKNCLS STERI-STRIP NONHPOA (GAUZE/BANDAGES/DRESSINGS) ×2
BENZOIN TINCTURE PRP APPL 2/3 (GAUZE/BANDAGES/DRESSINGS) ×3 IMPLANT
BIT DRILL NEURO 2X3.1 SFT TUCH (MISCELLANEOUS) ×2 IMPLANT
BLADE SURG 15 STRL LF DISP TIS (BLADE) ×2 IMPLANT
BLADE SURG 15 STRL SS (BLADE) ×3
BLADE SURG ROTATE 9660 (MISCELLANEOUS) ×3 IMPLANT
BUR MATCHSTICK NEURO 3.0 LAGG (BURR) ×3 IMPLANT
CARTRIDGE OIL MAESTRO DRILL (MISCELLANEOUS) ×2 IMPLANT
CLOTH BEACON ORANGE TIMEOUT ST (SAFETY) ×3 IMPLANT
CLSR STERI-STRIP ANTIMIC 1/2X4 (GAUZE/BANDAGES/DRESSINGS) ×2 IMPLANT
COLLAR CERV LO CONTOUR FIRM DE (SOFTGOODS) IMPLANT
CORDS BIPOLAR (ELECTRODE) ×3 IMPLANT
COVER SURGICAL LIGHT HANDLE (MISCELLANEOUS) ×3 IMPLANT
CRADLE DONUT ADULT HEAD (MISCELLANEOUS) ×3 IMPLANT
DEVICE ENDSKLTN MED 6 7MM (Orthopedic Implant) ×1 IMPLANT
DIFFUSER DRILL AIR PNEUMATIC (MISCELLANEOUS) ×3 IMPLANT
DRAIN JACKSON RD 7FR 3/32 (WOUND CARE) ×1 IMPLANT
DRAPE C-ARM 42X72 X-RAY (DRAPES) ×5 IMPLANT
DRAPE POUCH INSTRU U-SHP 10X18 (DRAPES) ×3 IMPLANT
DRAPE SURG 17X23 STRL (DRAPES) ×9 IMPLANT
DRILL NEURO 2X3.1 SOFT TOUCH (MISCELLANEOUS) ×3
DURAPREP 26ML APPLICATOR (WOUND CARE) ×3 IMPLANT
ELECT COATED BLADE 2.86 ST (ELECTRODE) ×3 IMPLANT
ELECT REM PT RETURN 9FT ADLT (ELECTROSURGICAL) ×3
ELECTRODE REM PT RTRN 9FT ADLT (ELECTROSURGICAL) ×2 IMPLANT
ENDOSKELETON MED 6 7MM (Orthopedic Implant) ×3 IMPLANT
EVACUATOR SILICONE 100CC (DRAIN) ×1 IMPLANT
GAUZE SPONGE 4X4 16PLY XRAY LF (GAUZE/BANDAGES/DRESSINGS) ×3 IMPLANT
GLOVE BIO SURGEON STRL SZ8 (GLOVE) ×3 IMPLANT
GLOVE BIOGEL PI IND STRL 8 (GLOVE) ×3 IMPLANT
GLOVE BIOGEL PI INDICATOR 8 (GLOVE) ×2
GLOVE BIOGEL PI ORTHO PRO SZ7 (GLOVE) ×1
GLOVE PI ORTHO PRO STRL SZ7 (GLOVE) ×1 IMPLANT
GLOVE SURG ORTHO 8.0 STRL STRW (GLOVE) ×2 IMPLANT
GLOVE SURG SS PI 7.0 STRL IVOR (GLOVE) ×2 IMPLANT
GLOVE SURG SS PI 7.5 STRL IVOR (GLOVE) ×2 IMPLANT
GOWN PREVENTION PLUS XLARGE (GOWN DISPOSABLE) ×1 IMPLANT
GOWN STRL NON-REIN LRG LVL3 (GOWN DISPOSABLE) ×3 IMPLANT
GOWN STRL REIN XL XLG (GOWN DISPOSABLE) ×6 IMPLANT
IV CATH 14GX2 1/4 (CATHETERS) ×3 IMPLANT
KIT BASIN OR (CUSTOM PROCEDURE TRAY) ×3 IMPLANT
KIT ROOM TURNOVER OR (KITS) ×3 IMPLANT
MANIFOLD NEPTUNE II (INSTRUMENTS) ×1 IMPLANT
NDL SPNL 20GX3.5 QUINCKE YW (NEEDLE) ×1 IMPLANT
NEEDLE 27GAX1X1/2 (NEEDLE) ×3 IMPLANT
NEEDLE SPNL 20GX3.5 QUINCKE YW (NEEDLE) ×3 IMPLANT
NS IRRIG 1000ML POUR BTL (IV SOLUTION) ×3 IMPLANT
OIL CARTRIDGE MAESTRO DRILL (MISCELLANEOUS) ×3
PACK ORTHO CERVICAL (CUSTOM PROCEDURE TRAY) ×3 IMPLANT
PAD ARMBOARD 7.5X6 YLW CONV (MISCELLANEOUS) ×6 IMPLANT
PATTIES SURGICAL .5 X.5 (GAUZE/BANDAGES/DRESSINGS) IMPLANT
PATTIES SURGICAL .5 X1 (DISPOSABLE) ×2 IMPLANT
PIN RETAINER PRODISC 14 MM (PIN) ×4 IMPLANT
PLATE LEVEL 1 TI VECTRA 14MM (Plate) ×2 IMPLANT
PUTTY BONE DBX 2.5 MIS (Bone Implant) ×2 IMPLANT
SCREW 4.0X16MM (Screw) ×8 IMPLANT
SPONGE GAUZE 4X4 12PLY (GAUZE/BANDAGES/DRESSINGS) ×3 IMPLANT
SPONGE INTESTINAL PEANUT (DISPOSABLE) ×5 IMPLANT
SPONGE SURGIFOAM ABS GEL 100 (HEMOSTASIS) ×2 IMPLANT
STRIP CLOSURE SKIN 1/2X4 (GAUZE/BANDAGES/DRESSINGS) ×3 IMPLANT
SURGIFLO TRUKIT (HEMOSTASIS) IMPLANT
SUT ETHILON 3 0 FSL (SUTURE) IMPLANT
SUT MNCRL AB 4-0 PS2 18 (SUTURE) ×2 IMPLANT
SUT PROLENE 3 0 PS 2 (SUTURE) IMPLANT
SUT SILK 2 0 TIES 17X18 (SUTURE) ×3
SUT SILK 2-0 18XBRD TIE BLK (SUTURE) ×2 IMPLANT
SUT SILK 4 0 (SUTURE)
SUT SILK 4-0 18XBRD TIE 12 (SUTURE) IMPLANT
SUT VIC AB 2-0 CT2 18 VCP726D (SUTURE) ×3 IMPLANT
SYR BULB IRRIGATION 50ML (SYRINGE) ×3 IMPLANT
SYR CONTROL 10ML LL (SYRINGE) ×3 IMPLANT
TAPE CLOTH 4X10 WHT NS (GAUZE/BANDAGES/DRESSINGS) ×1 IMPLANT
TAPE CLOTH SURG 4X10 WHT LF (GAUZE/BANDAGES/DRESSINGS) ×2 IMPLANT
TAPE CLOTH SURG 6X10 WHT LF (GAUZE/BANDAGES/DRESSINGS) ×2 IMPLANT
TAPE UMBILICAL COTTON 1/8X30 (MISCELLANEOUS) ×6 IMPLANT
TOWEL OR 17X24 6PK STRL BLUE (TOWEL DISPOSABLE) ×3 IMPLANT
TOWEL OR 17X26 10 PK STRL BLUE (TOWEL DISPOSABLE) ×3 IMPLANT
TRAY FOLEY CATH 14FR (SET/KITS/TRAYS/PACK) ×1 IMPLANT
WATER STERILE IRR 1000ML POUR (IV SOLUTION) ×3 IMPLANT
YANKAUER SUCT BULB TIP NO VENT (SUCTIONS) ×3 IMPLANT

## 2011-06-26 NOTE — Anesthesia Preprocedure Evaluation (Addendum)
Anesthesia Evaluation  Patient identified by MRN, date of birth, ID band Patient awake    Reviewed: Allergy & Precautions, H&P , NPO status , Patient's Chart, lab work & pertinent test results, reviewed documented beta blocker date and time   History of Anesthesia Complications (+) PONV and DIFFICULT AIRWAY  Airway Mallampati: II  Neck ROM: Full    Dental  (+) Teeth Intact   Pulmonary          Cardiovascular hypertension, Pt. on home beta blockers     Neuro/Psych    GI/Hepatic   Endo/Other  Diabetes mellitus- (Diet controlled), Type 2  Renal/GU      Musculoskeletal   Abdominal   Peds  Hematology   Anesthesia Other Findings   Reproductive/Obstetrics                         Anesthesia Physical Anesthesia Plan  ASA: I  Anesthesia Plan: General   Post-op Pain Management:    Induction: Intravenous  Airway Management Planned: Oral ETT  Additional Equipment:   Intra-op Plan:   Post-operative Plan: Extubation in OR  Informed Consent: I have reviewed the patients History and Physical, chart, labs and discussed the procedure including the risks, benefits and alternatives for the proposed anesthesia with the patient or authorized representative who has indicated his/her understanding and acceptance.     Plan Discussed with: CRNA, Anesthesiologist and Surgeon  Anesthesia Plan Comments:         Anesthesia Quick Evaluation

## 2011-06-26 NOTE — Progress Notes (Signed)
Orthopedic Tech Progress Note Patient Details:  Michael Gallagher Jul 03, 1975 [id number]  Other Ortho Devices Ortho Device Location: [facility] collar Ortho Device Interventions: Application   Cammer, Mickie Bail 06/26/2011, 3:50 PM

## 2011-06-26 NOTE — Progress Notes (Signed)
Upon arrival to pacu, pt had oral airway as well as bilat nasal airways in. Dr Katrinka Blazing here. Will monitor closely.

## 2011-06-26 NOTE — Progress Notes (Signed)
Pt coughing around oral airway, starting to have emesis--airway removed and oral suctioning. (left nare airway had been removed by crna within 5 min of arrival to pacu. Pt had approx 30 cc total of emesis, greenish/yellow color.  Will cont to monitor closely.

## 2011-06-26 NOTE — Anesthesia Postprocedure Evaluation (Signed)
  Anesthesia Post-op Note  Patient: Michael Gallagher  Procedure(s) Performed: Procedure(s) (LRB): ANTERIOR CERVICAL DECOMPRESSION/DISCECTOMY FUSION 1 LEVEL/HARDWARE REMOVAL (Left)  Patient Location: PACU  Anesthesia Type: General  Level of Consciousness: awake, oriented, sedated and patient cooperative  Airway and Oxygen Therapy: Patient Spontanous Breathing and Patient connected to nasal cannula oxygen  Post-op Pain: mild  Post-op Assessment: Post-op Vital signs reviewed, Patient's Cardiovascular Status Stable, Respiratory Function Stable, Patent Airway, No signs of Nausea or vomiting and Pain level controlled  Post-op Vital Signs: stable  Complications: No apparent anesthesia complications

## 2011-06-26 NOTE — Preoperative (Signed)
Beta Blockers   Reason not to administer Beta Blockers:Pt took B blocker @ 0430 06/26/11

## 2011-06-26 NOTE — H&P (Signed)
PREOPERATIVE H&P  Chief Complaint: L > R arm pain  HPI: Michael Gallagher is a 36 y.o. male who presents with cervcial radiculopathy  Past Medical History  Diagnosis Date  . Kidney stones   . Complication of anesthesia   . PONV (postoperative nausea and vomiting)   . Hypertension     Dr. Donnie Aho- did stress test, wnl, told that he was having chest wall pain    . Diabetes mellitus     told early diabetes- pre, told to monitor diet   . Arthritis     pt. reports- cerv. HNP   Past Surgical History  Procedure Date  . Cervical laminectomy 2007    C5-C6  . Cholecystectomy, laparoscopic   . Cholecystectomy    History   Social History  . Marital Status: Married    Spouse Name: N/A    Number of Children: N/A  . Years of Education: N/A   Social History Main Topics  . Smoking status: Never Smoker   . Smokeless tobacco: Not on file  . Alcohol Use: No  . Drug Use: No  . Sexually Active:    Other Topics Concern  . Not on file   Social History Narrative   Married, works as Science writer.   Family History  Problem Relation Age of Onset  . Coronary artery disease Father 85    CABG age 25  . Diabetes Father   . Hypertension Father   . Coronary artery disease Brother 47  . Anesthesia problems Neg Hx    No Known Allergies Prior to Admission medications   Medication Sig Start Date End Date Taking? Authorizing Provider  cyclobenzaprine (FLEXERIL) 5 MG tablet Take 5 mg by mouth 3 (three) times daily as needed.   Yes Historical Provider, MD  HYDROcodone-acetaminophen (NORCO) 10-325 MG per tablet Take 2 tablets by mouth every 4 (four) hours as needed. For pain   Yes Historical Provider, MD  lisinopril (PRINIVIL,ZESTRIL) 10 MG tablet Take 10 mg by mouth every morning.    Yes Historical Provider, MD  methocarbamol (ROBAXIN) 750 MG tablet Take 750 mg by mouth 3 (three) times daily as needed. For pain   Yes Historical Provider, MD  metoprolol (LOPRESSOR) 50 MG tablet Take 50 mg by mouth  2 (two) times daily.   Yes Historical Provider, MD  acetaminophen (TYLENOL) 325 MG tablet Take 650 mg by mouth every 6 (six) hours as needed. For pain    Historical Provider, MD  ibuprofen (ADVIL,MOTRIN) 200 MG tablet Take 200 mg by mouth every 8 (eight) hours as needed. For pain    Historical Provider, MD     All other systems have been reviewed and were otherwise negative with the exception of those mentioned in the HPI and as above.  Physical Exam: Filed Vitals:   06/26/11 0631  BP: 130/87  Pulse: 66  Temp: 97.8 F (36.6 C)  Resp: 20    General: Alert, no acute distress Cardiovascular: No pedal edema Respiratory: No cyanosis, no use of accessory musculature GI: No organomegaly, abdomen is soft and non-tender Skin: No lesions in the area of chief complaint Neurologic: Sensation intact distally Psychiatric: Patient is competent for consent with normal mood and affect Lymphatic: No axillary or cervical lymphadenopathy  MUSCULOSKELETAL: + spurling's on left  Assessment/Plan: Left > R arm pain Plan for Procedure(s): ANTERIOR CERVICAL DECOMPRESSION/DISCECTOMY FUSION C6/7 with hardware removal   Emilee Hero, MD 06/26/2011 6:52 AM

## 2011-06-26 NOTE — Anesthesia Procedure Notes (Signed)
Procedure Name: Intubation Date/Time: 06/26/2011 7:43 AM Performed by: Rossie Muskrat L Pre-anesthesia Checklist: Patient identified, Emergency Drugs available, Suction available, Patient being monitored and Timeout performed Patient Re-evaluated:Patient Re-evaluated prior to inductionOxygen Delivery Method: Circle system utilized Preoxygenation: Pre-oxygenation with 100% oxygen Intubation Type: IV induction Ventilation: Mask ventilation without difficulty Laryngoscope Size: Miller and 2 Grade View: Grade I Tube type: Oral Tube size: 7.5 mm Number of attempts: 1 Airway Equipment and Method: Stylet Placement Confirmation: ETT inserted through vocal cords under direct vision,  positive ETCO2 and breath sounds checked- equal and bilateral Secured at: 22 cm Tube secured with: Tape Dental Injury: Teeth and Oropharynx as per pre-operative assessment

## 2011-06-26 NOTE — Progress Notes (Signed)
Report to [employer] as primary caregiver

## 2011-06-26 NOTE — Transfer of Care (Signed)
Immediate Anesthesia Transfer of Care Note  Patient: Michael Gallagher  Procedure(s) Performed: Procedure(s) (LRB): ANTERIOR CERVICAL DECOMPRESSION/DISCECTOMY FUSION 1 LEVEL/HARDWARE REMOVAL (Left)  Patient Location: PACU  Anesthesia Type: General  Level of Consciousness: awake and alert   Airway & Oxygen Therapy: Patient Spontanous Breathing and Patient connected to nasal cannula oxygen  Post-op Assessment: Report given to PACU RN, Post -op Vital signs reviewed and stable and Patient moving all extremities  Post vital signs: Reviewed and stable  Complications: No apparent anesthesia complications

## 2011-06-26 NOTE — Op Note (Signed)
NAME:  Michael Gallagher, AGUINALDO NO.:  [id number]  MEDICAL RECORD NO.:  [id number]  LOCATION:  3524                         FACILITY:  [facility]  PHYSICIAN:  Estill Bamberg, MD      DATE OF BIRTH:  [birth year]  DATE OF PROCEDURE:  06/26/2011 DATE OF DISCHARGE:                              OPERATIVE REPORT   PREOPERATIVE DIAGNOSES: 1. Left-sided cervical radiculopathy. 2. Status post C5-6 anterior cervical decompression and fusion. 3. C6-7 degenerative disk disease causing left-sided neuroforaminal     stenosis.  POSTOPERATIVE DIAGNOSES: 1. Left-sided cervical radiculopathy. 2. Status post C5-6 anterior cervical decompression and fusion. 3. C6-7 degenerative disk disease causing left-sided neuroforaminal     stenosis.  PROCEDURE: 1. C6-7 anterior cervical decompression and fusion. 2. Placement of anterior instrumentation, C6-C7. 3. Placement of interbody device x1 (7 mm Titan lordotic interbody     cage). 4. Use of local autograft. 5. Use of morselized allograft (DBX mix). 6. Exploration of previous spinal fusion at the C5-6 level. 7. Removal of anterior instrumentation, C5-6.  SURGEON:  Estill Bamberg, M.D.  ASSISTANT:  Janace Litten, OPA.  ANESTHESIA:  General endotracheal anesthesia.  COMPLICATIONS:  None.  DISPOSITION:  Stable.  ESTIMATED BLOOD LOSS:  Minimal.  INDICATIONS FOR PROCEDURE:  Briefly, Mr. Michael Gallagher is a very pleasant 63- year-old male who presents to me with severe debilitating pain in the left arm.  Of note, the patient is status post an anterior cervical decompression and fusion at the C5-6 level by Dr. Alveda Reasons.  The indication for this procedure was right arm pain.  The patient did state that his right arm pain did improve.  However, at the time of my evaluation of the patient in February 2013, he had severe pain in the left arm and did have extensive conservative treatment measures without any improvement. Again, we did go forward with  conservative measures and the patient did continue to have severe debilitating pain in addition to weakness involving the left arm.  I did review an MRI, which was clearly notable for a very large extruded C6-7 disk fragment, which was clearly causing compression of the exiting C7 nerve.  The patient is status post C5-6 fusion.  It did appear that the patient's fusion was successful, there was no lucency of the screws noted.  Of note, given the fact that the patient did have a previous left-sided approach by Dr. Alveda Reasons, I did have the patient to have an ENT evaluation, which did confirm the normal bilateral vocal cord mobility.  I did therefore elect to go forward with the right-sided approach, given the scar that would be encountered on the left side and a high risk associated with it.  Also of note, the patient does have diabetes and he does understand that this does increase the risk of a possible nonunion.  OPERATIVE REPORT:  On June 26, 2011, the patient was brought to surgery and general endotracheal anesthesia was administered.  The patient was placed supine on a well-padded hospital bed.  All bony prominences were meticulously padded.  The neck was placed in a gentle degree of extension.  Antibiotics were given.  SCDs were placed.  Time-out procedure was performed.  I then  brought in lateral fluoroscopy to help optimize the location of the incision.  The neck was then prepped and draped.  I then made a right-sided incision in line with the patient's previous left-sided incision.  The plane between the sternocleidomastoid muscle laterally and strap muscles medially was readily identified and explored.  Deep in the plane, there was abundant scar tissue identified, as expected.  I did use extensive blunt dissection to safely and meticulously develop the proper planes to minimize any undue trauma to the esophagus and surrounding structures including the carotid artery and carotid  sheath.  The anterior cervical spine was readily noted.  The previously placed anterior plate was noted as well.  I did remove all scar tissue noted about the plate and I did use the appropriate instrumentation to remove the C5-6 anterior cervical plate.  Bone wax was placed into the area where the previous vertebral body screws were placed.  I then turned my attention towards the C6-7 interspace.  Again, there was noted to be extensive scar tissue identified and extensive osteophytes noted anteriorly.  The osteophytes were removed and placed on the back table for later use.  I then placed a self-retaining Shadow- Line retractor.  I then used a #15 blade knife to perform an annulotomy at the C6-7 interspace.  A diskectomy was performed in the typical fashion.  The posterior longitudinal ligament was readily encountered and taken down using a #1 Kerrison.  I then used a #1 Kerrison to perform a thorough decompression anterior to the spinal cord and dura. I then continued my decompression at the left neural foramen.  A very large extruded disk fragment was readily identified in the region of the left hemicord in the left neural foramen.  It was removed as 1 large fragment.  I then used a nerve hook to confirm that there was no continued pressure on either spinal cord or the exiting nerve.  At this point, the wound was copiously irrigated.  I prepared the endplates using a rasp in addition to a 3-mm high-speed bur to ensure parallel endplates.  I then placed a series of trials and felt that a 7-mm lordotic trial would be the most appropriate fit.  The trial was then packed with DBX mix in addition to autograft obtained from removing the osteophytes.  It was tamped into position in the usual fashion.  Caspar pins were then removed and the bone wax was placed in that place.  I then turned my attention towards exploring the C5-6 interspace to ensure an adequate fusion.  I did place a Caspar pin  into the C5 and C6 vertebral bodies.  A push-pull maneuver was performed and it did appear that the C5-6 construct was adequately fused, as there was only very minimal micro-motion identified.  I then chose an appropriately sized Synthes Vectra plate, which was placed over the anterior cervical spine at the C6-7 level.  I then placed 2 self-drilling, self-tapping vertebral angle screws into the vertebral bodies of C6 and 2 in C7.  I did note an excellent press-fit of each of the screws.  I did note that the screws were captured by the locking mechanism.  I then continued to obtain AP and lateral fluoroscopy to confirm appropriate positioning of the anterior cervical plate.  At this point, the wound was copiously irrigated.  The platysma and subcutaneous layer was closed using 2-0 Vicryl and the skin was closed using 4-0 Monocryl.  All instrument counts were correct at the termination  of the procedure.  Of note, Janace Litten was my assistant throughout the procedure and aided in essential retraction and suctioning, required throughout the surgery.     Estill Bamberg, MD     MD/MEDQ  D:  06/26/2011  T:  06/26/2011  Job:  [id number]  cc:   Doreen Beam, MD

## 2011-06-27 NOTE — Progress Notes (Signed)
Patient reports resolved arm pain.  Minimal neck discomfort.  Tolerating PO  AVSS Dressing CDI NVI  POD #1 after C6/7 acdf with hardware removal at C5/6  - d/c home today - f/u 2 weeks

## 2011-06-30 LAB — GLUCOSE, CAPILLARY
Glucose-Capillary: 149 mg/dL — ABNORMAL HIGH (ref 70–99)
Glucose-Capillary: 192 mg/dL — ABNORMAL HIGH (ref 70–99)

## 2011-07-01 ENCOUNTER — Encounter (HOSPITAL_COMMUNITY): Payer: Self-pay | Admitting: Orthopedic Surgery
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
## 2011-12-11 DIAGNOSIS — R079 Chest pain, unspecified: Secondary | ICD-10-CM

## 2012-05-07 DIAGNOSIS — R079 Chest pain, unspecified: Secondary | ICD-10-CM

## 2014-09-27 ENCOUNTER — Encounter (HOSPITAL_COMMUNITY): Payer: Self-pay | Admitting: *Deleted

## 2014-09-27 ENCOUNTER — Emergency Department (HOSPITAL_COMMUNITY): Payer: Managed Care, Other (non HMO)

## 2014-09-27 DIAGNOSIS — Z8739 Personal history of other diseases of the musculoskeletal system and connective tissue: Secondary | ICD-10-CM | POA: Diagnosis not present

## 2014-09-27 DIAGNOSIS — Z87442 Personal history of urinary calculi: Secondary | ICD-10-CM | POA: Insufficient documentation

## 2014-09-27 DIAGNOSIS — Z79899 Other long term (current) drug therapy: Secondary | ICD-10-CM | POA: Insufficient documentation

## 2014-09-27 DIAGNOSIS — I10 Essential (primary) hypertension: Secondary | ICD-10-CM | POA: Diagnosis not present

## 2014-09-27 DIAGNOSIS — E119 Type 2 diabetes mellitus without complications: Secondary | ICD-10-CM | POA: Diagnosis not present

## 2014-09-27 DIAGNOSIS — R61 Generalized hyperhidrosis: Secondary | ICD-10-CM | POA: Diagnosis not present

## 2014-09-27 DIAGNOSIS — R079 Chest pain, unspecified: Secondary | ICD-10-CM | POA: Insufficient documentation

## 2014-09-27 DIAGNOSIS — R0602 Shortness of breath: Secondary | ICD-10-CM | POA: Diagnosis not present

## 2014-09-27 DIAGNOSIS — R42 Dizziness and giddiness: Secondary | ICD-10-CM | POA: Insufficient documentation

## 2014-09-27 LAB — CBC WITH DIFFERENTIAL/PLATELET
BASOS ABS: 0.1 10*3/uL (ref 0.0–0.1)
BASOS PCT: 1 % (ref 0–1)
EOS PCT: 2 % (ref 0–5)
Eosinophils Absolute: 0.3 10*3/uL (ref 0.0–0.7)
HEMATOCRIT: 43.9 % (ref 39.0–52.0)
HEMOGLOBIN: 15 g/dL (ref 13.0–17.0)
LYMPHS ABS: 3.9 10*3/uL (ref 0.7–4.0)
LYMPHS PCT: 38 % (ref 12–46)
MCH: 29.8 pg (ref 26.0–34.0)
MCHC: 34.2 g/dL (ref 30.0–36.0)
MCV: 87.1 fL (ref 78.0–100.0)
MONO ABS: 0.8 10*3/uL (ref 0.1–1.0)
MONOS PCT: 8 % (ref 3–12)
NEUTROS ABS: 5.2 10*3/uL (ref 1.7–7.7)
Neutrophils Relative %: 51 % (ref 43–77)
Platelets: 268 10*3/uL (ref 150–400)
RBC: 5.04 MIL/uL (ref 4.22–5.81)
RDW: 12.6 % (ref 11.5–15.5)
WBC: 10.2 10*3/uL (ref 4.0–10.5)

## 2014-09-27 LAB — BASIC METABOLIC PANEL
ANION GAP: 8 (ref 5–15)
BUN: 19 mg/dL (ref 6–20)
CHLORIDE: 100 mmol/L — AB (ref 101–111)
CO2: 28 mmol/L (ref 22–32)
CREATININE: 0.93 mg/dL (ref 0.61–1.24)
Calcium: 9.3 mg/dL (ref 8.9–10.3)
GFR calc non Af Amer: >60 mL/min (ref >60)
Glucose, Bld: 97 mg/dL (ref 65–99)
POTASSIUM: 4 mmol/L (ref 3.5–5.1)
SODIUM: 136 mmol/L (ref 135–145)

## 2014-09-27 LAB — BRAIN NATRIURETIC PEPTIDE: B Natriuretic Peptide: 4.5 pg/mL (ref 0.0–100.0)

## 2014-09-27 LAB — TROPONIN I

## 2014-09-27 NOTE — ED Notes (Signed)
The paijn is worse with movement

## 2014-09-27 NOTE — ED Notes (Signed)
Pt transported to xray 

## 2014-09-27 NOTE — ED Notes (Signed)
The pt has had general chest pain with lt arm radiation for 4 hours with dizziness.  He appears stressed  And apprehensive rapid breathing.  He alos has lt neck pain no complaints of numbness

## 2014-09-28 ENCOUNTER — Emergency Department (HOSPITAL_COMMUNITY)
Admission: EM | Admit: 2014-09-28 | Discharge: 2014-09-28 | Disposition: A | Discharge status: Home / Self Care | Payer: Managed Care, Other (non HMO) | Attending: Emergency Medicine | Admitting: Emergency Medicine

## 2014-09-28 DIAGNOSIS — R079 Chest pain, unspecified: Secondary | ICD-10-CM

## 2014-09-28 MED ORDER — ASPIRIN 81 MG PO CHEW
324.0000 mg | CHEWABLE_TABLET | Freq: Once | ORAL | Status: AC
  Administered 2014-09-28: 324 mg via ORAL
  Filled 2014-09-28: qty 4

## 2014-09-28 MED ORDER — IBUPROFEN 800 MG PO TABS
800.0000 mg | ORAL_TABLET | Freq: Once | ORAL | Status: AC
  Administered 2014-09-28: 800 mg via ORAL
  Filled 2014-09-28: qty 1

## 2014-09-28 MED ORDER — NAPROXEN 500 MG PO TABS: 500.0000 mg | ORAL_TABLET | Freq: Two times a day (BID) | ORAL | Status: DC

## 2014-09-28 NOTE — Discharge Instructions (Signed)
Talk with your doctor about possibly getting referred for cardiac stress testing.   Chest Pain (Nonspecific) It is often hard to give a specific diagnosis for the cause of chest pain. There is always a chance that your pain could be related to something serious, such as a heart attack or a blood clot in the lungs. You need to follow up with your health care provider for further evaluation. CAUSES   Heartburn.  Pneumonia or bronchitis.  Anxiety or stress.  Inflammation around your heart (pericarditis) or lung (pleuritis or pleurisy).  A blood clot in the lung.  A collapsed lung (pneumothorax). It can develop suddenly on its own (spontaneous pneumothorax) or from trauma to the chest.  Shingles infection (herpes zoster virus). The chest wall is composed of bones, muscles, and cartilage. Any of these can be the source of the pain.  The bones can be bruised by injury.  The muscles or cartilage can be strained by coughing or overwork.  The cartilage can be affected by inflammation and become sore (costochondritis). DIAGNOSIS  Lab tests or other studies may be needed to find the cause of your pain. Your health care provider may have you take a test called an ambulatory electrocardiogram (ECG). An ECG records your heartbeat patterns over a 24-hour period. You may also have other tests, such as:  Transthoracic echocardiogram (TTE). During echocardiography, sound waves are used to evaluate how blood flows through your heart.  Transesophageal echocardiogram (TEE).  Cardiac monitoring. This allows your health care provider to monitor your heart rate and rhythm in real time.  Holter monitor. This is a portable device that records your heartbeat and can help diagnose heart arrhythmias. It allows your health care provider to track your heart activity for several days, if needed.  Stress tests by exercise or by giving medicine that makes the heart beat faster. TREATMENT   Treatment depends  on what may be causing your chest pain. Treatment may include:  Acid blockers for heartburn.  Anti-inflammatory medicine.  Pain medicine for inflammatory conditions.  Antibiotics if an infection is present.  You may be advised to change lifestyle habits. This includes stopping smoking and avoiding alcohol, caffeine, and chocolate.  You may be advised to keep your head raised (elevated) when sleeping. This reduces the chance of acid going backward from your stomach into your esophagus. Most of the time, nonspecific chest pain will improve within 2-3 days with rest and mild pain medicine.  HOME CARE INSTRUCTIONS   If antibiotics were prescribed, take them as directed. Finish them even if you start to feel better.  For the next few days, avoid physical activities that bring on chest pain. Continue physical activities as directed.  Do not use any tobacco products, including cigarettes, chewing tobacco, or electronic cigarettes.  Avoid drinking alcohol.  Only take medicine as directed by your health care provider.  Follow your health care provider's suggestions for further testing if your chest pain does not go away.  Keep any follow-up appointments you made. If you do not go to an appointment, you could develop lasting (chronic) problems with pain. If there is any problem keeping an appointment, call to reschedule. SEEK MEDICAL CARE IF:   Your chest pain does not go away, even after treatment.  You have a rash with blisters on your chest.  You have a fever. SEEK IMMEDIATE MEDICAL CARE IF:   You have increased chest pain or pain that spreads to your arm, neck, jaw, back, or abdomen.  You have shortness of breath.  You have an increasing cough, or you cough up blood.  You have severe back or abdominal pain.  You feel nauseous or vomit.  You have severe weakness.  You faint.  You have chills. This is an emergency. Do not wait to see if the pain will go away. Get medical  help at once. Call your local emergency services (911 in [place] Do not drive yourself to the hospital. MAKE SURE YOU:   Understand these instructions.  Will watch your condition.  Will get help right away if you are not doing well or get worse. Document Released: 12/25/2004 Document Revised: 03/22/2013 Document Reviewed: 10/21/2007 [facility] Patient Information 2015 [place]. This information is not intended to replace advice given to you by your health care provider. Make sure you discuss any questions you have with your health care provider.  Naproxen and naproxen sodium oral immediate-release tablets What is this medicine? NAPROXEN (na PROX en) is a non-steroidal anti-inflammatory drug (NSAID). It is used to reduce swelling and to treat pain. This medicine may be used for dental pain, headache, or painful monthly periods. It is also used for painful joint and muscular problems such as arthritis, tendinitis, bursitis, and gout. This medicine may be used for other purposes; ask your health care provider or pharmacist if you have questions. COMMON BRAND NAME(S): Aflaxen, Aleve, Aleve Arthritis, All Day Relief, Anaprox, Anaprox DS, Naprosyn What should I tell my health care provider before I take this medicine? They need to know if you have any of these conditions: -asthma -cigarette smoker -drink more than 3 alcohol containing drinks a day -heart disease or circulation problems such as heart failure or leg edema (fluid retention) -high blood pressure -kidney disease -liver disease -stomach bleeding or ulcers -an unusual or allergic reaction to naproxen, aspirin, other NSAIDs, other medicines, foods, dyes, or preservatives -pregnant or trying to get pregnant -breast-feeding How should I use this medicine? Take this medicine by mouth with a glass of water. Follow the directions on the prescription label. Take it with food if your stomach gets upset. Try to not lie down for at least  10 minutes after you take it. Take your medicine at regular intervals. Do not take your medicine more often than directed. Long-term, continuous use may increase the risk of heart attack or stroke. A special MedGuide will be given to you by the pharmacist with each prescription and refill. Be sure to read this information carefully each time. Talk to your pediatrician regarding the use of this medicine in children. Special care may be needed. Overdosage: If you think you have taken too much of this medicine contact a poison control center or emergency room at once. NOTE: This medicine is only for you. Do not share this medicine with others. What if I miss a dose? If you miss a dose, take it as soon as you can. If it is almost time for your next dose, take only that dose. Do not take double or extra doses. What may interact with this medicine? -alcohol -aspirin -cidofovir -diuretics -lithium -methotrexate -other drugs for inflammation like ketorolac or prednisone -pemetrexed -probenecid -warfarin This list may not describe all possible interactions. Give your health care provider a list of all the medicines, herbs, non-prescription drugs, or dietary supplements you use. Also tell them if you smoke, drink alcohol, or use illegal drugs. Some items may interact with your medicine. What should I watch for while using this medicine? Tell your doctor  or health care professional if your pain does not get better. Talk to your doctor before taking another medicine for pain. Do not treat yourself. This medicine does not prevent heart attack or stroke. In fact, this medicine may increase the chance of a heart attack or stroke. The chance may increase with longer use of this medicine and in people who have heart disease. If you take aspirin to prevent heart attack or stroke, talk with your doctor or health care professional. Do not take other medicines that contain aspirin, ibuprofen, or naproxen with this  medicine. Side effects such as stomach upset, nausea, or ulcers may be more likely to occur. Many medicines available without a prescription should not be taken with this medicine. This medicine can cause ulcers and bleeding in the stomach and intestines at any time during treatment. Do not smoke cigarettes or drink alcohol. These increase irritation to your stomach and can make it more susceptible to damage from this medicine. Ulcers and bleeding can happen without warning symptoms and can cause death. You may get drowsy or dizzy. Do not drive, use machinery, or do anything that needs mental alertness until you know how this medicine affects you. Do not stand or sit up quickly, especially if you are an older patient. This reduces the risk of dizzy or fainting spells. This medicine can cause you to bleed more easily. Try to avoid damage to your teeth and gums when you brush or floss your teeth. What side effects may I notice from receiving this medicine? Side effects that you should report to your doctor or health care professional as soon as possible: -black or bloody stools, blood in the urine or vomit -blurred vision -chest pain -difficulty breathing or wheezing -nausea or vomiting -severe stomach pain -skin rash, skin redness, blistering or peeling skin, hives, or itching -slurred speech or weakness on one side of the body -swelling of eyelids, throat, lips -unexplained weight gain or swelling -unusually weak or tired -yellowing of eyes or skin Side effects that usually do not require medical attention (report to your doctor or health care professional if they continue or are bothersome): -constipation -headache -heartburn This list may not describe all possible side effects. Call your doctor for medical advice about side effects. You may report side effects to FDA at 1-800-FDA-1088. Where should I keep my medicine? Keep out of the reach of children. Store at room temperature between 15  and 30 degrees C (59 and 86 degrees F). Keep container tightly closed. Throw away any unused medicine after the expiration date. NOTE: This sheet is a summary. It may not cover all possible information. If you have questions about this medicine, talk to your doctor, pharmacist, or health care provider.  2015, [employer] Standard. (2009-03-19 20:10:16)

## 2014-09-28 NOTE — ED Provider Notes (Signed)
CSN: [id number]     Arrival date & time 09/27/14  2210 History  This chart was scribed for Dione Booze, MD by Tanda Rockers, ED Scribe. This patient was seen in room D36C/D36C and the patient's care was started at 12:34 AM.  Chief Complaint  Patient presents with  . Chest Pain   The history is provided by the patient. No language interpreter was used.    HPI Comments: Michael Gallagher is a 39 y.o. male with hx HTN resolved with weightloss and DM who presents to the Emergency Department complaining of intermittent chest pain radiating to left arm that began Wednesday evening, 09/26/2014 (approximately 2 days ago). Pt describes it as a stabbing sensation. He rates the pain as a 5/10 on the pain scale while sitting still and a 7/10 with movement. There is nothing that makes the pain better. Denies exacerbation with deep breathing. He also complains of shortness of breath and dizziness. Pt mentions that he has been having increased diaphoresis recently as well. Denies nausea, vomiting, or any other symptoms. Pt denies smoking cigarettes or drinking EtOH. Pt has positive fhx of cardiac issues with brother who had CABG in his 53's and father who had CABG at 15 years old.   Past Medical History  Diagnosis Date  . Kidney stones   . Complication of anesthesia   . PONV (postoperative nausea and vomiting)   . Hypertension     Dr. Donnie Aho- did stress test, wnl, told that he was having chest wall pain    . Diabetes mellitus     told early diabetes- pre, told to monitor diet   . Arthritis     pt. reports- cerv. HNP   Past Surgical History  Procedure Laterality Date  . Cervical laminectomy  2007    C5-C6  . Cholecystectomy, laparoscopic    . Cholecystectomy    . Anterior cervical decomp/discectomy fusion  06/26/2011    Procedure: ANTERIOR CERVICAL DECOMPRESSION/DISCECTOMY FUSION 1 LEVEL/HARDWARE REMOVAL;  Surgeon: Emilee Hero, MD;  Location: [facility] OR;  Service: Orthopedics;  Laterality: Left;   ACDF C6-7 with Hardware removal   Family History  Problem Relation Age of Onset  . Coronary artery disease Father 9    CABG age 36  . Diabetes Father   . Hypertension Father   . Coronary artery disease Brother 43  . Anesthesia problems Neg Hx    History  Substance Use Topics  . Smoking status: Never Smoker   . Smokeless tobacco: Not on file  . Alcohol Use: No    Review of Systems  Constitutional: Positive for diaphoresis.  Respiratory: Positive for shortness of breath.   Cardiovascular: Positive for chest pain.  Gastrointestinal: Negative for nausea and vomiting.  Neurological: Positive for dizziness.  All other systems reviewed and are negative.  Allergies  Review of patient's allergies indicates no known allergies.  Home Medications   Prior to Admission medications   Medication Sig Start Date End Date Taking? Authorizing Provider  lisinopril (PRINIVIL,ZESTRIL) 10 MG tablet Take 10 mg by mouth every morning.     Historical Provider, MD  metoprolol (LOPRESSOR) 50 MG tablet Take 50 mg by mouth 2 (two) times daily.    Historical Provider, MD   Triage Vitals: BP 163/92 mmHg  Pulse 62  Temp(Src) 98.1 F (36.7 C) (Oral)  Resp 16  Ht  (1.905 m)  Wt 244 lb 6.4 oz (110.859 kg)  BMI 30.55 kg/m2  SpO2 100%   Physical Exam  Constitutional: He  is oriented to person, place, and time. He appears well-developed and well-nourished. No distress.  HENT:  Head: Normocephalic and atraumatic.  Eyes: Conjunctivae and EOM are normal. Pupils are equal, round, and reactive to light.  Neck: Normal range of motion. Neck supple. No JVD present.  Cardiovascular: Normal rate, regular rhythm and normal heart sounds.   No murmur heard. No difference in blood pressure right arm compared to left arm  Pulmonary/Chest: Effort normal and breath sounds normal. He has no wheezes. He has no rales. He exhibits no tenderness.  Abdominal: Soft. Bowel sounds are normal. He exhibits no distension  and no mass. There is no tenderness.  Musculoskeletal: Normal range of motion. He exhibits no edema.  Lymphadenopathy:    He has no cervical adenopathy.  Neurological: He is alert and oriented to person, place, and time. No cranial nerve deficit. He exhibits normal muscle tone. Coordination normal.  Skin: Skin is warm and dry. No rash noted.  Psychiatric: He has a normal mood and affect. His behavior is normal. Judgment and thought content normal.  Nursing note and vitals reviewed.   ED Course  Procedures (including critical care time)  DIAGNOSTIC STUDIES: Oxygen Saturation is 100% on RA, normal by my interpretation.    COORDINATION OF CARE: 12:41 AM-Discussed treatment plan which includes pain medication with pt at bedside and pt agreed to plan.   Labs Review Labs Reviewed  BASIC METABOLIC PANEL - Abnormal; Notable for the following:    Chloride 100 (*)    All other components within normal limits  BRAIN NATRIURETIC PEPTIDE  TROPONIN I  CBC WITH DIFFERENTIAL/PLATELET    Imaging Review Dg Chest 2 View  09/27/2014   CLINICAL DATA:  39 year old male with left-sided chest and arm pain and shortness of breath.  EXAM: CHEST  2 VIEW  COMPARISON:  None.  FINDINGS: The heart size and mediastinal contours are within normal limits. Both lungs are clear. The visualized skeletal structures are unremarkable. Surgical fusion fixation plate and screw is noted. Right upper quadrant cholecystectomy clips.  IMPRESSION: No active cardiopulmonary disease.   Electronically Signed   By: Elgie CollardArash  Radparvar M.D.   On: 09/27/2014 23:06     EKG Interpretation   Date/Time:  Wednesday September 27 2014 22:14:51 EDT Ventricular Rate:  61 PR Interval:  170 QRS Duration: 90 QT Interval:  416 QTC Calculation: 418 R Axis:   16 Text Interpretation:  Normal sinus rhythm Cannot rule out Anterior infarct  , age undetermined Abnormal ECG When compared with ECG of 04/30/2011,  Nonspecific T wave abnormality is no  longer Present Confirmed by [facility]GL[facility]   MD, Ninfa Giannelli ([id number]) on 09/28/2014 12:27:38 AM      MDM   Final diagnoses:  Chest pain, unspecified chest pain type    Chest pain of uncertain cause. Pattern is not worrisome for cardiac pain. ECG is unremarkable and troponin is negative. Chest x-ray is unremarkable. No difference in blood pressure between arms. He is given a dose of ibuprofen in the ED with significant relief of pain. He does have a strong family history of coronary disease and should have stress testing done as an outpatient and I have discussed this with him. He is discharged with prescription for naproxen and is referred back to his PCP.   I personally performed the services described in this documentation, which was scribed in my presence. The recorded information has been reviewed and is accurate.       Dione Boozeavid Idelle Reimann, MD 09/28/14 [phone]

## 2014-12-27 ENCOUNTER — Ambulatory Visit (INDEPENDENT_AMBULATORY_CARE_PROVIDER_SITE_OTHER): Payer: Managed Care, Other (non HMO) | Admitting: Psychology

## 2014-12-27 DIAGNOSIS — F4323 Adjustment disorder with mixed anxiety and depressed mood: Secondary | ICD-10-CM | POA: Diagnosis not present

## 2015-01-10 ENCOUNTER — Ambulatory Visit: Payer: Managed Care, Other (non HMO) | Admitting: Psychology

## 2015-01-12 ENCOUNTER — Ambulatory Visit: Payer: Managed Care, Other (non HMO) | Admitting: Psychology

## 2015-01-22 ENCOUNTER — Encounter: Payer: Self-pay | Admitting: Family

## 2015-01-22 ENCOUNTER — Ambulatory Visit (INDEPENDENT_AMBULATORY_CARE_PROVIDER_SITE_OTHER): Payer: Managed Care, Other (non HMO) | Admitting: Family

## 2015-01-22 ENCOUNTER — Encounter: Payer: Self-pay | Admitting: Family Medicine

## 2015-01-22 VITALS — BP 142/76 | HR 72 | Temp 98.4°F | Resp 16 | Wt 247.6 lb

## 2015-01-22 DIAGNOSIS — H6691 Otitis media, unspecified, right ear: Secondary | ICD-10-CM | POA: Diagnosis not present

## 2015-01-22 DIAGNOSIS — R202 Paresthesia of skin: Secondary | ICD-10-CM | POA: Diagnosis not present

## 2015-01-22 DIAGNOSIS — M79609 Pain in unspecified limb: Secondary | ICD-10-CM

## 2015-01-22 MED ORDER — AMOXICILLIN 500 MG PO CAPS: 500.0000 mg | ORAL_CAPSULE | Freq: Three times a day (TID) | ORAL | Status: DC

## 2015-01-22 MED ORDER — MELOXICAM 7.5 MG PO TABS: 7.5000 mg | ORAL_TABLET | Freq: Every day | ORAL | Status: DC

## 2015-01-22 NOTE — Progress Notes (Signed)
Subjective:    Patient ID: ILHAN MADAN, male    DOB: [birth year], 39 y.o.   MRN: [id number]  HPI  Mr. Abercrombie is a 39 yr old male who presents today for an acute visit. He will be establishing with Dr. Abner Greenspan in February. He reports soreness "on my right side." Symptoms began 2 days ago.  Reports right face, scalp, shoulder and arm feel "hypersensitive."  Reports mild neck pain.  Denies sore throat.  Denies fever or sick contacts.  Cervical disc disease- pt is s/p C5-C6 laminectomy 2007 and anterior cervical discectomy/fusion 3/13.  He has followed with Dr. Rochele Pages in the past.  Has tried ibuprofen with only mild improvement.   Review of Systems  Constitutional: Negative for unexpected weight change.  HENT: Positive for ear pain and rhinorrhea.   Respiratory: Negative for cough.   Cardiovascular: Negative for leg swelling.  Gastrointestinal: Negative for nausea, vomiting and diarrhea.  Genitourinary: Negative for dysuria and frequency.  Musculoskeletal: Negative for arthralgias.  Skin: Negative for rash.  Neurological:       Skin surface right forehead/scalp hurt  Hematological: Negative for adenopathy.  Psychiatric/Behavioral:       Denies depression/anxiety   Past Medical History  Diagnosis Date  . Kidney stones   . Complication of anesthesia   . PONV (postoperative nausea and vomiting)   . Hypertension     Dr. Donnie Aho- did stress test, wnl, told that he was having chest wall pain    . Diabetes mellitus     told early diabetes- pre, told to monitor diet   . Arthritis     pt. reports- cerv. HNP    Social History   Social History  . Marital Status: Married    Spouse Name: N/A  . Number of Children: N/A  . Years of Education: N/A   Occupational History  . Not on file.   Social History Main Topics  . Smoking status: Never Smoker   . Smokeless tobacco: Not on file  . Alcohol Use: No  . Drug Use: No  . Sexual Activity: Not on file   Other Topics Concern  . Not  on file   Social History Narrative   Married, works as Science writer.          Past Surgical History  Procedure Laterality Date  . Cervical laminectomy  2007    C5-C6  . Cholecystectomy, laparoscopic    . Cholecystectomy    . Anterior cervical decomp/discectomy fusion  06/26/2011    Procedure: ANTERIOR CERVICAL DECOMPRESSION/DISCECTOMY FUSION 1 LEVEL/HARDWARE REMOVAL;  Surgeon: Emilee Hero, MD;  Location: [facility] OR;  Service: Orthopedics;  Laterality: Left;  ACDF C6-7 with Hardware removal    Family History  Problem Relation Age of Onset  . Coronary artery disease Father 1    CABG age 28  . Diabetes Father   . Hypertension Father   . Heart disease Father   . Coronary artery disease Brother 49  . Anesthesia problems Neg Hx   . Diabetes Mother     No Known Allergies  No current outpatient prescriptions on file prior to visit.   No current facility-administered medications on file prior to visit.    BP 142/76 mmHg  Pulse 72  Temp(Src) 98.4 F (36.9 C) (Oral)  Resp 16  Wt 247 lb 9.6 oz (112.311 kg)  SpO2 98%       Objective:   Physical Exam  Constitutional: He is oriented to person, place, and time. He appears  well-developed and well-nourished. No distress.  HENT:  Head: Normocephalic and atraumatic.  Right Ear: Ear canal normal. Tympanic membrane is erythematous.  Left Ear: Ear canal normal. Tympanic membrane is not erythematous.  Mouth/Throat: No oropharyngeal exudate.  Eyes: No scleral icterus.  Cardiovascular: Normal rate and regular rhythm.   No murmur heard. Pulmonary/Chest: Effort normal and breath sounds normal. No respiratory distress. He has no wheezes. He has no rales. He exhibits no tenderness.  Musculoskeletal: He exhibits no edema.  Lymphadenopathy:    He has no cervical adenopathy.  Neurological: He is alert and oriented to person, place, and time. He displays normal reflexes. He exhibits normal muscle tone. Coordination normal.    Subjective difference in sensation right scalp/cheek/arm versus left side + facial symmetry, bilateral UE/LE strength is 5/5  Skin: Skin is warm and dry.  Psychiatric: He has a normal mood and affect. His behavior is normal. Judgment and thought content normal.          Assessment & Plan:  R otitis media- will rx with amoxicillin.    Paresthesia- Does not follow a clear dermatome pattern.  He does have hx of cervical disc disease.  Chart reviewed- CBC, BMET unremarkable.  Will obtain b12/folate level as well as cbc.  Trial of meloxicam.  Consider MRI of C spine if symptoms worsen or do not improve and possibly neuro referral.    BP is mildly elevated- will need re-evaluation at his upcoming visit in February.

## 2015-01-22 NOTE — Progress Notes (Signed)
See previous note, accidentally sent to wrong person

## 2015-01-22 NOTE — Patient Instructions (Signed)
Start meloxicam once daily for pain.  Call if your symptoms worsen or if symptoms are not improved in 1-2 weeks. Start amoxicillin for ear pain.  Call if ear pain worsens or does not improve.

## 2015-01-22 NOTE — Progress Notes (Signed)
I think I would have done the same thing, I might have added a sed rate and would proceed with MRI if symptoms worsen or do not improve. Let me know if no resolution I could squeeze him in for follow up next month.

## 2015-01-22 NOTE — Progress Notes (Signed)
Pre visit review using our clinic review tool, if applicable. No additional management support is needed unless otherwise documented below in the visit note. 

## 2015-01-23 LAB — VITAMIN B12: Vitamin B-12: 525 pg/mL (ref 211–911)

## 2015-01-23 LAB — TSH: TSH: 0.92 u[IU]/mL (ref 0.35–4.50)

## 2015-01-23 LAB — FOLATE: Folate: 18.1 ng/mL (ref >5.9)

## 2015-02-20 ENCOUNTER — Encounter: Payer: Self-pay | Admitting: Physician Assistant

## 2015-02-20 ENCOUNTER — Ambulatory Visit (INDEPENDENT_AMBULATORY_CARE_PROVIDER_SITE_OTHER): Payer: Managed Care, Other (non HMO) | Admitting: Physician Assistant

## 2015-02-20 VITALS — BP 142/88 | HR 65 | Temp 98.1°F | Resp 16 | Ht 75.0 in | Wt 249.2 lb

## 2015-02-20 DIAGNOSIS — J019 Acute sinusitis, unspecified: Secondary | ICD-10-CM

## 2015-02-20 DIAGNOSIS — B9689 Other specified bacterial agents as the cause of diseases classified elsewhere: Secondary | ICD-10-CM | POA: Insufficient documentation

## 2015-02-20 MED ORDER — AMOXICILLIN-POT CLAVULANATE 875-125 MG PO TABS: 1.0000 | ORAL_TABLET | Freq: Two times a day (BID) | ORAL | Status: DC

## 2015-02-20 NOTE — Progress Notes (Signed)
Pre visit review using our clinic review tool, if applicable. No additional management support is needed unless otherwise documented below in the visit note/SLS  

## 2015-02-20 NOTE — Progress Notes (Signed)
   History of Present Illness: Michael Gallagher is a 39 y.o. male who present to the clinic today complaining of sinus pressure, sinus pain and nasal congestion. Patient endorses facial pain and sore throat.  Patient denies fever, chills, cough..   History: Past Medical History  Diagnosis Date  . Kidney stones   . Complication of anesthesia   . PONV (postoperative nausea and vomiting)   . Hypertension     Dr. Donnie Ahoilley- did stress test, wnl, told that he was having chest wall pain    . Diabetes mellitus     told early diabetes- pre, told to monitor diet   . Arthritis     pt. reports- cerv. HNP    Current outpatient prescriptions:  .  amoxicillin-clavulanate (AUGMENTIN) 875-125 MG tablet, Take 1 tablet by mouth 2 (two) times daily., Disp: 14 tablet, Rfl: 0 No Known Allergies Family History  Problem Relation Age of Onset  . Coronary artery disease Father 1243    CABG age 37  . Diabetes Father   . Hypertension Father   . Heart disease Father   . Coronary artery disease Brother 1937  . Anesthesia problems Neg Hx   . Diabetes Mother    Social History   Social History  . Marital Status: Married    Spouse Name: N/A  . Number of Children: N/A  . Years of Education: N/A   Social History Main Topics  . Smoking status: Never Smoker   . Smokeless tobacco: None  . Alcohol Use: No  . Drug Use: No  . Sexual Activity: Not Asked   Other Topics Concern  . None   Social History Narrative   Married, works as Science writerdispatcher.         Review of Systems: See HPI.  All other ROS are negative.  Physical Examination: BP 142/88 mmHg  Pulse 65  Temp(Src) 98.1 F (36.7 C) (Oral)  Resp 16  Ht 6\' 3"  (1.905 m)  Wt 249 lb 4 oz (113.059 kg)  BMI 31.15 kg/m2  SpO2 95%  General appearance: alert, cooperative and appears stated age Head: Normocephalic, without obvious abnormality, atraumatic, sinuses tender to percussion Eyes: conjunctivae/corneas clear. PERRL, EOM's intact. Fundi  benign. Ears: normal TM's and external ear canals both ears Nose: moderate congestion, turbinates swollen, sinus tenderness bilateral Throat: lips, mucosa, and tongue normal; teeth and gums normal Neck: no adenopathy, no carotid bruit, no JVD, supple, symmetrical, trachea midline and thyroid not enlarged, symmetric, no tenderness/mass/nodules Lungs: clear to auscultation bilaterally Chest wall: no tenderness  Assessment/Plan: Acute bacterial sinusitis Rx Augmentin.  Increase fluids.  Rest.  Saline nasal spray.  Probiotic.  Mucinex as directed.  Humidifier in bedroom.  Call or return to clinic if symptoms are not improving.

## 2015-02-20 NOTE — Assessment & Plan Note (Signed)
Rx Augmentin.  Increase fluids.  Rest.  Saline nasal spray.  Probiotic.  Mucinex as directed.  Humidifier in bedroom.  Call or return to clinic if symptoms are not improving.  

## 2015-02-20 NOTE — Patient Instructions (Signed)
Please take antibiotic as directed.  Increase fluid intake.  Use Saline nasal spray.  Take a daily multivitamin. Continue Mucinex. Place a humidifier in the bedroom.  Please call or return clinic if symptoms are not improving.  Sinusitis Sinusitis is redness, soreness, and swelling (inflammation) of the paranasal sinuses. Paranasal sinuses are air pockets within the bones of your face (beneath the eyes, the middle of the forehead, or above the eyes). In healthy paranasal sinuses, mucus is able to drain out, and air is able to circulate through them by way of your nose. However, when your paranasal sinuses are inflamed, mucus and air can become trapped. This can allow bacteria and other germs to grow and cause infection. Sinusitis can develop quickly and last only a short time (acute) or continue over a long period (chronic). Sinusitis that lasts for more than 12 weeks is considered chronic.  CAUSES  Causes of sinusitis include:  Allergies.  Structural abnormalities, such as displacement of the cartilage that separates your nostrils (deviated septum), which can decrease the air flow through your nose and sinuses and affect sinus drainage.  Functional abnormalities, such as when the small hairs (cilia) that line your sinuses and help remove mucus do not work properly or are not present. SYMPTOMS  Symptoms of acute and chronic sinusitis are the same. The primary symptoms are pain and pressure around the affected sinuses. Other symptoms include:  Upper toothache.  Earache.  Headache.  Bad breath.  Decreased sense of smell and taste.  A cough, which worsens when you are lying flat.  Fatigue.  Fever.  Thick drainage from your nose, which often is green and may contain pus (purulent).  Swelling and warmth over the affected sinuses. DIAGNOSIS  Your caregiver will perform a physical exam. During the exam, your caregiver may:  Look in your nose for signs of abnormal growths in your  nostrils (nasal polyps).  Tap over the affected sinus to check for signs of infection.  View the inside of your sinuses (endoscopy) with a special imaging device with a light attached (endoscope), which is inserted into your sinuses. If your caregiver suspects that you have chronic sinusitis, one or more of the following tests may be recommended:  Allergy tests.  Nasal culture A sample of mucus is taken from your nose and sent to a lab and screened for bacteria.  Nasal cytology A sample of mucus is taken from your nose and examined by your caregiver to determine if your sinusitis is related to an allergy. TREATMENT  Most cases of acute sinusitis are related to a viral infection and will resolve on their own within 10 days. Sometimes medicines are prescribed to help relieve symptoms (pain medicine, decongestants, nasal steroid sprays, or saline sprays).  However, for sinusitis related to a bacterial infection, your caregiver will prescribe antibiotic medicines. These are medicines that will help kill the bacteria causing the infection.  Rarely, sinusitis is caused by a fungal infection. In theses cases, your caregiver will prescribe antifungal medicine. For some cases of chronic sinusitis, surgery is needed. Generally, these are cases in which sinusitis recurs more than 3 times per year, despite other treatments. HOME CARE INSTRUCTIONS   Drink plenty of water. Water helps thin the mucus so your sinuses can drain more easily.  Use a humidifier.  Inhale steam 3 to 4 times a day (for example, sit in the bathroom with the shower running).  Apply a warm, moist washcloth to your face 3 to 4 times a   day, or as directed by your caregiver.  Use saline nasal sprays to help moisten and clean your sinuses.  Take over-the-counter or prescription medicines for pain, discomfort, or fever only as directed by your caregiver. SEEK IMMEDIATE MEDICAL CARE IF:  You have increasing pain or severe  headaches.  You have nausea, vomiting, or drowsiness.  You have swelling around your face.  You have vision problems.  You have a stiff neck.  You have difficulty breathing. MAKE SURE YOU:   Understand these instructions.  Will watch your condition.  Will get help right away if you are not doing well or get worse. Document Released: 03/17/2005 Document Revised: 06/09/2011 Document Reviewed: 04/01/2011 [employer] Patient Information 2014 [employer].   

## 2015-04-17 ENCOUNTER — Telehealth: Payer: Self-pay | Admitting: Family Medicine

## 2015-04-17 ENCOUNTER — Ambulatory Visit (INDEPENDENT_AMBULATORY_CARE_PROVIDER_SITE_OTHER): Payer: Managed Care, Other (non HMO) | Admitting: Physician Assistant

## 2015-04-17 ENCOUNTER — Telehealth: Payer: Self-pay | Admitting: Internal Medicine

## 2015-04-17 ENCOUNTER — Encounter: Payer: Self-pay | Admitting: Physician Assistant

## 2015-04-17 VITALS — BP 148/103 | HR 70 | Temp 97.7°F | Ht 75.0 in | Wt 248.2 lb

## 2015-04-17 DIAGNOSIS — I10 Essential (primary) hypertension: Secondary | ICD-10-CM

## 2015-04-17 DIAGNOSIS — B9689 Other specified bacterial agents as the cause of diseases classified elsewhere: Secondary | ICD-10-CM

## 2015-04-17 DIAGNOSIS — J019 Acute sinusitis, unspecified: Secondary | ICD-10-CM

## 2015-04-17 MED ORDER — AMOXICILLIN-POT CLAVULANATE 875-125 MG PO TABS: 1.0000 | ORAL_TABLET | Freq: Two times a day (BID) | ORAL | Status: DC

## 2015-04-17 MED ORDER — LISINOPRIL 10 MG PO TABS: 10.0000 mg | ORAL_TABLET | Freq: Every day | ORAL | Status: DC

## 2015-04-17 NOTE — Assessment & Plan Note (Signed)
Rx Augmentin.  Increase fluids.  Rest.  Saline nasal spray.  Probiotic.  Mucinex as directed.  Humidifier in bedroom. Avoid decongestants due to elevated BP.  Call or return to clinic if symptoms are not improving.

## 2015-04-17 NOTE — Telephone Encounter (Signed)
Pt was seen by Malva Cogan, PA-C at 2:15 pm today (04/17/15).

## 2015-04-17 NOTE — Assessment & Plan Note (Addendum)
Repeat BP at 150/98. DASH diet encouraged. Rx Lisinopril 10 mg to take daily. Exercise recommended. Follow-up in 2 weeks for repeat assessment of BP

## 2015-04-17 NOTE — Patient Instructions (Signed)
Please start the BP medication daily as directed. Increase fluid intake. Avoid salt intake. Stay active to promote weight loss which helps BP.  Follow-up with me in 2-3 weeks.  Please take antibiotic as directed.  Increase fluid intake.  Use Saline nasal spray.  Take a daily multivitamin. No decongestants!  Place a humidifier in the bedroom.  Please call or return clinic if symptoms are not improving.  Sinusitis Sinusitis is redness, soreness, and swelling (inflammation) of the paranasal sinuses. Paranasal sinuses are air pockets within the bones of your face (beneath the eyes, the middle of the forehead, or above the eyes). In healthy paranasal sinuses, mucus is able to drain out, and air is able to circulate through them by way of your nose. However, when your paranasal sinuses are inflamed, mucus and air can become trapped. This can allow bacteria and other germs to grow and cause infection. Sinusitis can develop quickly and last only a short time (acute) or continue over a long period (chronic). Sinusitis that lasts for more than 12 weeks is considered chronic.  CAUSES  Causes of sinusitis include:  Allergies.  Structural abnormalities, such as displacement of the cartilage that separates your nostrils (deviated septum), which can decrease the air flow through your nose and sinuses and affect sinus drainage.  Functional abnormalities, such as when the small hairs (cilia) that line your sinuses and help remove mucus do not work properly or are not present. SYMPTOMS  Symptoms of acute and chronic sinusitis are the same. The primary symptoms are pain and pressure around the affected sinuses. Other symptoms include:  Upper toothache.  Earache.  Headache.  Bad breath.  Decreased sense of smell and taste.  A cough, which worsens when you are lying flat.  Fatigue.  Fever.  Thick drainage from your nose, which often is green and may contain pus (purulent).  Swelling and warmth over  the affected sinuses. DIAGNOSIS  Your caregiver will perform a physical exam. During the exam, your caregiver may:  Look in your nose for signs of abnormal growths in your nostrils (nasal polyps).  Tap over the affected sinus to check for signs of infection.  View the inside of your sinuses (endoscopy) with a special imaging device with a light attached (endoscope), which is inserted into your sinuses. If your caregiver suspects that you have chronic sinusitis, one or more of the following tests may be recommended:  Allergy tests.  Nasal culture A sample of mucus is taken from your nose and sent to a lab and screened for bacteria.  Nasal cytology A sample of mucus is taken from your nose and examined by your caregiver to determine if your sinusitis is related to an allergy. TREATMENT  Most cases of acute sinusitis are related to a viral infection and will resolve on their own within 10 days. Sometimes medicines are prescribed to help relieve symptoms (pain medicine, decongestants, nasal steroid sprays, or saline sprays).  However, for sinusitis related to a bacterial infection, your caregiver will prescribe antibiotic medicines. These are medicines that will help kill the bacteria causing the infection.  Rarely, sinusitis is caused by a fungal infection. In theses cases, your caregiver will prescribe antifungal medicine. For some cases of chronic sinusitis, surgery is needed. Generally, these are cases in which sinusitis recurs more than 3 times per year, despite other treatments. HOME CARE INSTRUCTIONS   Drink plenty of water. Water helps thin the mucus so your sinuses can drain more easily.  Use a humidifier.  Inhale steam 3 to 4 times a day (for example, sit in the bathroom with the shower running).  Apply a warm, moist washcloth to your face 3 to 4 times a day, or as directed by your caregiver.  Use saline nasal sprays to help moisten and clean your sinuses.  Take  over-the-counter or prescription medicines for pain, discomfort, or fever only as directed by your caregiver. SEEK IMMEDIATE MEDICAL CARE IF:  You have increasing pain or severe headaches.  You have nausea, vomiting, or drowsiness.  You have swelling around your face.  You have vision problems.  You have a stiff neck.  You have difficulty breathing. MAKE SURE YOU:   Understand these instructions.  Will watch your condition.  Will get help right away if you are not doing well or get worse. Document Released: 03/17/2005 Document Revised: 06/09/2011 Document Reviewed: 04/01/2011 [facility] Patient Information 2014 [place], [place].

## 2015-04-17 NOTE — Telephone Encounter (Signed)
Patient Name: Michael Gallagher  DOB: 12/31/1975    Initial Comment Caller states his bp is 178/110, face flushed and hot, has 2:15   Nurse Assessment  Nurse: Vickey Sages, RN, Jacquilin Date/Time (Eastern Time): 04/17/2015 12:31:42 PM  Confirm and document reason for call. If symptomatic, describe symptoms. You must click the next button to save text entered. ---Caller states his bp is 178/110, face flushed and hot, has 2:15 appointment. Denies any chest pain or SOB- No other symptoms.  Has the patient traveled out of the country within the last 30 days? ---Not Applicable  Does the patient have any new or worsening symptoms? ---Yes  Will a triage be completed? ---Yes  Related visit to physician within the last 2 weeks? ---No  Does the PT have any chronic conditions? (i.e. diabetes, asthma, etc.) ---No  Is this a behavioral health or substance abuse call? ---No     Guidelines    Guideline Title Affirmed Question Affirmed Notes  High Blood Pressure BP ? 180/110    Final Disposition User   See Physician within 24 Hours Atkins, RN, Jacquilin    Referrals  REFERRED TO PCP OFFICE   Disagree/Comply: Comply

## 2015-04-17 NOTE — Progress Notes (Signed)
Patient presents to clinic today c/o elevated BP over the past two days averaging 160/100. Has noted headache. Patient denies chest pain, palpitations, lightheadedness, dizziness, vision changes. Does have history of hypertension previously controlled with diet and exercise.  BP Readings from Last 3 Encounters:  04/17/15 148/103  02/20/15 142/88  01/22/15 142/76   Patient also endorses 3 weeks of sinus pressure, sinus pain, ear pain and fatigue. Denies fever, chills, cough or chest congestion. Does have history of sinus infections.  Past Medical History  Diagnosis Date  . Kidney stones   . Complication of anesthesia   . PONV (postoperative nausea and vomiting)   . Hypertension     Dr. Donnie Aho- did stress test, wnl, told that he was having chest wall pain    . Diabetes mellitus     told early diabetes- pre, told to monitor diet   . Arthritis     pt. reports- cerv. HNP    No current outpatient prescriptions on file prior to visit.   No current facility-administered medications on file prior to visit.    No Known Allergies  Family History  Problem Relation Age of Onset  . Coronary artery disease Father 42    CABG age 31  . Diabetes Father   . Hypertension Father   . Heart disease Father   . Coronary artery disease Brother 72  . Anesthesia problems Neg Hx   . Diabetes Mother     Social History   Social History  . Marital Status: Married    Spouse Name: N/A  . Number of Children: N/A  . Years of Education: N/A   Social History Main Topics  . Smoking status: Never Smoker   . Smokeless tobacco: None  . Alcohol Use: No  . Drug Use: No  . Sexual Activity: Not Asked   Other Topics Concern  . None   Social History Narrative   Married, works as Science writer.          Review of Systems - See HPI.  All other ROS are negative.  BP 148/103 mmHg  Pulse 70  Temp(Src) 97.7 F (36.5 C) (Oral)  Ht  (1.905 m)  Wt 248 lb 3.2 oz (112.583 kg)  BMI 31.02 kg/m2   SpO2 100%  Physical Exam  Constitutional: He is oriented to person, place, and time and well-developed, well-nourished, and in no distress.  HENT:  Head: Normocephalic and atraumatic.  Eyes: Conjunctivae are normal.  Cardiovascular: Normal rate, regular rhythm, normal heart sounds and intact distal pulses.   Pulmonary/Chest: Effort normal and breath sounds normal. No respiratory distress. He has no wheezes. He has no rales. He exhibits no tenderness.  Neurological: He is alert and oriented to person, place, and time.  Skin: Skin is warm and dry. No rash noted.  Psychiatric: Affect normal.  Vitals reviewed.   Recent Results (from the past 2160 hour(s))  B12     Status: None   Collection Time: 01/22/15  2:08 PM  Result Value Ref Range   Vitamin B-12 525 211 - 911 pg/mL  Folate     Status: None   Collection Time: 01/22/15  2:08 PM  Result Value Ref Range   Folate 18.1 >5.9 ng/mL  TSH     Status: None   Collection Time: 01/22/15  2:08 PM  Result Value Ref Range   TSH 0.92 0.35 - 4.50 uIU/mL    Assessment/Plan: Acute bacterial sinusitis Rx Augmentin.  Increase fluids.  Rest.  Saline nasal  spray.  Probiotic.  Mucinex as directed.  Humidifier in bedroom. Avoid decongestants due to elevated BP.  Call or return to clinic if symptoms are not improving.   Hypertension  Repeat BP at 150/98. DASH diet encouraged. Rx Lisinopril 10 mg to take daily. Exercise recommended. Follow-up in 2 weeks for repeat assessment of BP

## 2015-04-17 NOTE — Progress Notes (Signed)
Pre visit review using our clinic review tool, if applicable. No additional management support is needed unless otherwise documented below in the visit note. 

## 2015-04-17 NOTE — Telephone Encounter (Signed)
Caller name:Self  Can be reached: [phone]  Pharmacy:  [id number] - [place], [place] - [address] AT [facility] [address] [phone] (Phone) [phone] (Fax)       Reason for call: Patient saw Selena Batten today and was told that he was sending a rx for high BP to the pharmacy. Antibiotic was sent but nothing else. Plse Adv

## 2015-04-17 NOTE — Telephone Encounter (Signed)
Informed the patient provider just sent in BP medication.

## 2015-04-17 NOTE — Addendum Note (Signed)
Addended by: Marcelline Mates on: 04/17/2015 03:30 PM   Modules accepted: Orders, SmartSet

## 2015-05-01 ENCOUNTER — Encounter: Payer: Self-pay | Admitting: Physician Assistant

## 2015-05-01 ENCOUNTER — Ambulatory Visit (INDEPENDENT_AMBULATORY_CARE_PROVIDER_SITE_OTHER): Payer: Managed Care, Other (non HMO) | Admitting: Physician Assistant

## 2015-05-01 VITALS — BP 135/74 | HR 78 | Temp 98.1°F | Ht 75.0 in | Wt 247.4 lb

## 2015-05-01 DIAGNOSIS — I10 Essential (primary) hypertension: Secondary | ICD-10-CM | POA: Diagnosis not present

## 2015-05-01 NOTE — Assessment & Plan Note (Signed)
BP much improved. Continue lisinopril as directed. DASH handout given and diet again encouraged. Continue exercise regimen. Follow-up in 3 months.

## 2015-05-01 NOTE — Progress Notes (Signed)
Pre visit review using our clinic review tool, if applicable. No additional management support is needed unless otherwise documented below in the visit note. 

## 2015-05-01 NOTE — Patient Instructions (Addendum)
Please continue the medications as directed. Your blood pressure is looking much better. Continue your gym habits. Follow the diet below.  Follow-up with Dr. Abner Greenspan in 3 months.  DASH Eating Plan DASH stands for "Dietary Approaches to Stop Hypertension." The DASH eating plan is a healthy eating plan that has been shown to reduce high blood pressure (hypertension). Additional health benefits may include reducing the risk of type 2 diabetes mellitus, heart disease, and stroke. The DASH eating plan may also help with weight loss. WHAT DO I NEED TO KNOW ABOUT THE DASH EATING PLAN? For the DASH eating plan, you will follow these general guidelines:  Choose foods with a percent daily value for sodium of less than 5% (as listed on the food label).  Use salt-free seasonings or herbs instead of table salt or sea salt.  Check with your health care provider or pharmacist before using salt substitutes.  Eat lower-sodium products, often labeled as "lower sodium" or "no salt added."  Eat fresh foods.  Eat more vegetables, fruits, and low-fat dairy products.  Choose whole grains. Look for the word "whole" as the first word in the ingredient list.  Choose fish and skinless chicken or [place] more often than red meat. Limit fish, poultry, and meat to 6 oz (170 g) each day.  Limit sweets, desserts, sugars, and sugary drinks.  Choose heart-healthy fats.  Limit cheese to 1 oz (28 g) per day.  Eat more home-cooked food and less restaurant, buffet, and fast food.  Limit fried foods.  Cook foods using methods other than frying.  Limit canned vegetables. If you do use them, rinse them well to decrease the sodium.  When eating at a restaurant, ask that your food be prepared with less salt, or no salt if possible. WHAT FOODS CAN I EAT? Seek help from a dietitian for individual calorie needs. Grains Whole grain or whole wheat bread. Brown rice. Whole grain or whole wheat pasta. Quinoa, bulgur, and  whole grain cereals. Low-sodium cereals. Corn or whole wheat flour tortillas. Whole grain cornbread. Whole grain crackers. Low-sodium crackers. Vegetables Fresh or frozen vegetables (raw, steamed, roasted, or grilled). Low-sodium or reduced-sodium tomato and vegetable juices. Low-sodium or reduced-sodium tomato sauce and paste. Low-sodium or reduced-sodium canned vegetables.  Fruits All fresh, canned (in natural juice), or frozen fruits. Meat and Other Protein Products Ground beef (85% or leaner), grass-fed beef, or beef trimmed of fat. Skinless chicken or [place]. Ground chicken or [place]. Pork trimmed of fat. All fish and seafood. Eggs. Dried beans, peas, or lentils. Unsalted nuts and seeds. Unsalted canned beans. Dairy Low-fat dairy products, such as skim or 1% milk, 2% or reduced-fat cheeses, low-fat ricotta or cottage cheese, or plain low-fat yogurt. Low-sodium or reduced-sodium cheeses. Fats and Oils Tub margarines without trans fats. Light or reduced-fat mayonnaise and salad dressings (reduced sodium). Avocado. Safflower, olive, or canola oils. Natural peanut or almond butter. Other Unsalted popcorn and pretzels. The items listed above may not be a complete list of recommended foods or beverages. Contact your dietitian for more options. WHAT FOODS ARE NOT RECOMMENDED? Grains White bread. White pasta. White rice. Refined cornbread. Bagels and croissants. Crackers that contain trans fat. Vegetables Creamed or fried vegetables. Vegetables in a cheese sauce. Regular canned vegetables. Regular canned tomato sauce and paste. Regular tomato and vegetable juices. Fruits Dried fruits. Canned fruit in light or heavy syrup. Fruit juice. Meat and Other Protein Products Fatty cuts of meat. Ribs, chicken wings, bacon, sausage, bologna, salami, chitterlings, fatback,  hot dogs, bratwurst, and packaged luncheon meats. Salted nuts and seeds. Canned beans with salt. Dairy Whole or 2% milk, cream,  half-and-half, and cream cheese. Whole-fat or sweetened yogurt. Full-fat cheeses or blue cheese. Nondairy creamers and whipped toppings. Processed cheese, cheese spreads, or cheese curds. Condiments Onion and garlic salt, seasoned salt, table salt, and sea salt. Canned and packaged gravies. Worcestershire sauce. Tartar sauce. Barbecue sauce. Teriyaki sauce. Soy sauce, including reduced sodium. Steak sauce. Fish sauce. Oyster sauce. Cocktail sauce. Horseradish. Ketchup and mustard. Meat flavorings and tenderizers. Bouillon cubes. Hot sauce. Tabasco sauce. Marinades. Taco seasonings. Relishes. Fats and Oils Butter, stick margarine, lard, shortening, ghee, and bacon fat. Coconut, palm kernel, or palm oils. Regular salad dressings. Other Pickles and olives. Salted popcorn and pretzels. The items listed above may not be a complete list of foods and beverages to avoid. Contact your dietitian for more information. WHERE CAN I FIND MORE INFORMATION? National Heart, Lung, and Blood Institute: travelstabloid.com   This information is not intended to replace advice given to you by your health care provider. Make sure you discuss any questions you have with your health care provider.   Document Released: 03/06/2011 Document Revised: 04/07/2014 Document Reviewed: 01/19/2013 Elsevier Interactive Patient Education [employer].

## 2015-05-01 NOTE — Progress Notes (Signed)
    Patient presents to clinic today for 2 week follow-up of hypertension after being started on lisinopril 10 mg daily. Patient endorses taking medication as directed. Endorses feeling better with medication. Patient denies chest pain, palpitations, lightheadedness, dizziness, vision changes or frequent headaches. Denies side effects of medication.   BP Readings from Last 3 Encounters:  05/01/15 135/74  04/17/15 148/103  02/20/15 142/88    Past Medical History  Diagnosis Date  . Kidney stones   . Complication of anesthesia   . PONV (postoperative nausea and vomiting)   . Hypertension     Dr. Donnie Aho- did stress test, wnl, told that he was having chest wall pain    . Diabetes mellitus     told early diabetes- pre, told to monitor diet   . Arthritis     pt. reports- cerv. HNP    Current Outpatient Prescriptions on File Prior to Visit  Medication Sig Dispense Refill  . lisinopril (PRINIVIL,ZESTRIL) 10 MG tablet Take 1 tablet (10 mg total) by mouth daily. 30 tablet 3   No current facility-administered medications on file prior to visit.    No Known Allergies  Family History  Problem Relation Age of Onset  . Coronary artery disease Father 88    CABG age 51  . Diabetes Father   . Hypertension Father   . Heart disease Father   . Coronary artery disease Brother 67  . Anesthesia problems Neg Hx   . Diabetes Mother     Social History   Social History  . Marital Status: Married    Spouse Name: N/A  . Number of Children: N/A  . Years of Education: N/A   Social History Main Topics  . Smoking status: Never Smoker   . Smokeless tobacco: None  . Alcohol Use: No  . Drug Use: No  . Sexual Activity: Not Asked   Other Topics Concern  . None   Social History Narrative   Married, works as Science writer.         Review of Systems - See HPI.  All other ROS are negative.  BP 135/74 mmHg  Pulse 78  Temp(Src) 98.1 F (36.7 C) (Oral)  Ht  (1.905 m)  Wt 247 lb 6.4 oz  (112.22 kg)  BMI 30.92 kg/m2  SpO2 100%  Physical Exam  Constitutional: He is oriented to person, place, and time and well-developed, well-nourished, and in no distress.  HENT:  Head: Normocephalic and atraumatic.  Eyes: Conjunctivae are normal.  Cardiovascular: Normal rate, regular rhythm, normal heart sounds and intact distal pulses.   Pulmonary/Chest: Effort normal and breath sounds normal. No respiratory distress. He has no wheezes. He has no rales. He exhibits no tenderness.  Neurological: He is alert and oriented to person, place, and time.  Skin: Skin is warm and dry. No rash noted.  Psychiatric: Affect normal.  Vitals reviewed.  No results found for this or any previous visit (from the past 2160 hour(s)).  Assessment/Plan: Hypertension BP much improved. Continue lisinopril as directed. DASH handout given and diet again encouraged. Continue exercise regimen. Follow-up in 3 months.

## 2015-05-04 ENCOUNTER — Ambulatory Visit: Payer: Managed Care, Other (non HMO) | Admitting: Family Medicine

## 2015-08-10 ENCOUNTER — Telehealth: Payer: Self-pay | Admitting: Behavioral Health

## 2015-08-10 NOTE — Telephone Encounter (Signed)
Unable to reach patient at time of Pre-Visit Call.  Left message for patient to return call when available.    

## 2015-08-13 ENCOUNTER — Ambulatory Visit (INDEPENDENT_AMBULATORY_CARE_PROVIDER_SITE_OTHER): Payer: Managed Care, Other (non HMO) | Admitting: Family Medicine

## 2015-08-13 ENCOUNTER — Encounter: Payer: Self-pay | Admitting: Family Medicine

## 2015-08-13 VITALS — BP 152/90 | HR 72 | Temp 98.2°F | Ht 75.0 in | Wt 246.2 lb

## 2015-08-13 DIAGNOSIS — I10 Essential (primary) hypertension: Secondary | ICD-10-CM | POA: Diagnosis not present

## 2015-08-13 DIAGNOSIS — E782 Mixed hyperlipidemia: Secondary | ICD-10-CM | POA: Diagnosis not present

## 2015-08-13 DIAGNOSIS — J3089 Other allergic rhinitis: Secondary | ICD-10-CM | POA: Diagnosis not present

## 2015-08-13 DIAGNOSIS — E669 Obesity, unspecified: Secondary | ICD-10-CM

## 2015-08-13 DIAGNOSIS — J302 Other seasonal allergic rhinitis: Secondary | ICD-10-CM

## 2015-08-13 DIAGNOSIS — R112 Nausea with vomiting, unspecified: Secondary | ICD-10-CM | POA: Insufficient documentation

## 2015-08-13 DIAGNOSIS — N200 Calculus of kidney: Secondary | ICD-10-CM | POA: Insufficient documentation

## 2015-08-13 DIAGNOSIS — Z9889 Other specified postprocedural states: Secondary | ICD-10-CM

## 2015-08-13 HISTORY — DX: Other seasonal allergic rhinitis: J30.2

## 2015-08-13 MED ORDER — LISINOPRIL 10 MG PO TABS: 10.0000 mg | ORAL_TABLET | Freq: Two times a day (BID) | ORAL | Status: DC

## 2015-08-13 NOTE — Patient Instructions (Addendum)
Encouraged increased rest and hydration, add probiotics such as [employer] cap daily, zinc such as Coldeze or Xicam. Vitamin c 500 to 1000 mg and Elderberry caps,Treat fevers as needed. Mucinex twice daily x 10 days.   DASH Eating Plan DASH stands for "Dietary Approaches to Stop Hypertension." The DASH eating plan is a healthy eating plan that has been shown to reduce high blood pressure (hypertension). Additional health benefits may include reducing the risk of type 2 diabetes mellitus, heart disease, and stroke. The DASH eating plan may also help with weight loss. WHAT DO I NEED TO KNOW ABOUT THE DASH EATING PLAN? For the DASH eating plan, you will follow these general guidelines:  Choose foods with a percent daily value for sodium of less than 5% (as listed on the food label).  Use salt-free seasonings or herbs instead of table salt or sea salt.  Check with your health care provider or pharmacist before using salt substitutes.  Eat lower-sodium products, often labeled as "lower sodium" or "no salt added."  Eat fresh foods.  Eat more vegetables, fruits, and low-fat dairy products.  Choose whole grains. Look for the word "whole" as the first word in the ingredient list.  Choose fish and skinless chicken or [place]urkey more often than red meat. Limit fish, poultry, and meat to 6 oz (170 g) each day.  Limit sweets, desserts, sugars, and sugary drinks.  Choose heart-healthy fats.  Limit cheese to 1 oz (28 g) per day.  Eat more home-cooked food and less restaurant, buffet, and fast food.  Limit fried foods.  Cook foods using methods other than frying.  Limit canned vegetables. If you do use them, rinse them well to decrease the sodium.  When eating at a restaurant, ask that your food be prepared with less salt, or no salt if possible. WHAT FOODS CAN I EAT? Seek help from a dietitian for individual calorie needs. Grains Whole grain or whole wheat bread. Brown rice. Whole grain or  whole wheat pasta. Quinoa, bulgur, and whole grain cereals. Low-sodium cereals. Corn or whole wheat flour tortillas. Whole grain cornbread. Whole grain crackers. Low-sodium crackers. Vegetables Fresh or frozen vegetables (raw, steamed, roasted, or grilled). Low-sodium or reduced-sodium tomato and vegetable juices. Low-sodium or reduced-sodium tomato sauce and paste. Low-sodium or reduced-sodium canned vegetables.  Fruits All fresh, canned (in natural juice), or frozen fruits. Meat and Other Protein Products Ground beef (85% or leaner), grass-fed beef, or beef trimmed of fat. Skinless chicken or [place]key. Ground chicken or [place]key. Pork trimmed of fat. All fish and seafood. Eggs. Dried beans, peas, or lentils. Unsalted nuts and seeds. Unsalted canned beans. Dairy Low-fat dairy products, such as skim or 1% milk, 2% or reduced-fat cheeses, low-fat ricotta or cottage cheese, or plain low-fat yogurt. Low-sodium or reduced-sodium cheeses. Fats and Oils Tub margarines without trans fats. Light or reduced-fat mayonnaise and salad dressings (reduced sodium). Avocado. Safflower, olive, or canola oils. Natural peanut or almond butter. Other Unsalted popcorn and pretzels. The items listed above may not be a complete list of recommended foods or beverages. Contact your dietitian for more options. WHAT FOODS ARE NOT RECOMMENDED? Grains White bread. White pasta. White rice. Refined cornbread. Bagels and croissants. Crackers that contain trans fat. Vegetables Creamed or fried vegetables. Vegetables in a cheese sauce. Regular canned vegetables. Regular canned tomato sauce and paste. Regular tomato and vegetable juices. Fruits Dried fruits. Canned fruit in light or heavy syrup. Fruit juice. Meat and Other Protein Products Fatty cuts of meat.  Ribs, chicken wings, bacon, sausage, bologna, salami, chitterlings, fatback, hot dogs, bratwurst, and packaged luncheon meats. Salted nuts and seeds. Canned beans with  salt. Dairy Whole or 2% milk, cream, half-and-half, and cream cheese. Whole-fat or sweetened yogurt. Full-fat cheeses or blue cheese. Nondairy creamers and whipped toppings. Processed cheese, cheese spreads, or cheese curds. Condiments Onion and garlic salt, seasoned salt, table salt, and sea salt. Canned and packaged gravies. Worcestershire sauce. Tartar sauce. Barbecue sauce. Teriyaki sauce. Soy sauce, including reduced sodium. Steak sauce. Fish sauce. Oyster sauce. Cocktail sauce. Horseradish. Ketchup and mustard. Meat flavorings and tenderizers. Bouillon cubes. Hot sauce. Tabasco sauce. Marinades. Taco seasonings. Relishes. Fats and Oils Butter, stick margarine, lard, shortening, ghee, and bacon fat. Coconut, palm kernel, or palm oils. Regular salad dressings. Other Pickles and olives. Salted popcorn and pretzels. The items listed above may not be a complete list of foods and beverages to avoid. Contact your dietitian for more information. WHERE CAN I FIND MORE INFORMATION? National Heart, Lung, and Blood Institute: travelstabloid.com   This information is not intended to replace advice given to you by your health care provider. Make sure you discuss any questions you have with your health care provider.   Document Released: 03/06/2011 Document Revised: 04/07/2014 Document Reviewed: 01/19/2013 Elsevier Interactive Patient Education [employer].

## 2015-08-13 NOTE — Progress Notes (Signed)
Pre visit review using our clinic review tool, if applicable. No additional management support is needed unless otherwise documented below in the visit note. 

## 2015-08-13 NOTE — Assessment & Plan Note (Addendum)
Encouraged heart healthy diet such as the DASH diet and exercise as tolerated. Increase Lisinopril 10 mg tabs 1 tab po bid

## 2015-08-14 ENCOUNTER — Other Ambulatory Visit: Payer: Self-pay | Admitting: Physician Assistant

## 2015-08-14 LAB — TSH: TSH: 1.78 u[IU]/mL (ref 0.35–4.50)

## 2015-08-14 LAB — CBC
HCT: 41.2 % (ref 39.0–52.0)
HEMOGLOBIN: 14 g/dL (ref 13.0–17.0)
MCHC: 33.9 g/dL (ref 30.0–36.0)
MCV: 88.1 fl (ref 78.0–100.0)
PLATELETS: 254 10*3/uL (ref 150.0–400.0)
RBC: 4.68 Mil/uL (ref 4.22–5.81)
RDW: 12.2 % (ref 11.5–15.5)
WBC: 9.1 10*3/uL (ref 4.0–10.5)

## 2015-08-14 LAB — COMPREHENSIVE METABOLIC PANEL
ALBUMIN: 4.4 g/dL (ref 3.5–5.2)
ALT: 24 U/L (ref 0–53)
AST: 22 U/L (ref 0–37)
Alkaline Phosphatase: 51 U/L (ref 39–117)
BUN: 20 mg/dL (ref 6–23)
CHLORIDE: 103 meq/L (ref 96–112)
CO2: 28 mEq/L (ref 19–32)
CREATININE: 1.06 mg/dL (ref 0.40–1.50)
Calcium: 9.3 mg/dL (ref 8.4–10.5)
GFR: 83.08 mL/min (ref >60.00)
Glucose, Bld: 94 mg/dL (ref 70–99)
POTASSIUM: 4.2 meq/L (ref 3.5–5.1)
Sodium: 139 mEq/L (ref 135–145)
Total Bilirubin: 0.4 mg/dL (ref 0.2–1.2)
Total Protein: 7.1 g/dL (ref 6.0–8.3)

## 2015-08-14 LAB — LIPID PANEL
CHOL/HDL RATIO: 5
Cholesterol: 225 mg/dL — ABNORMAL HIGH (ref 0–200)
HDL: 41 mg/dL (ref >39.00)
LDL CALC: 160 mg/dL — AB (ref 0–99)
NonHDL: 184.05
TRIGLYCERIDES: 118 mg/dL (ref 0.0–149.0)
VLDL: 23.6 mg/dL (ref 0.0–40.0)

## 2015-08-19 ENCOUNTER — Encounter: Payer: Self-pay | Admitting: Family Medicine

## 2015-08-19 DIAGNOSIS — J3089 Other allergic rhinitis: Secondary | ICD-10-CM

## 2015-08-19 DIAGNOSIS — E782 Mixed hyperlipidemia: Secondary | ICD-10-CM

## 2015-08-19 DIAGNOSIS — E669 Obesity, unspecified: Secondary | ICD-10-CM | POA: Insufficient documentation

## 2015-08-19 HISTORY — DX: Other allergic rhinitis: J30.89

## 2015-08-19 HISTORY — DX: Obesity, unspecified: E66.9

## 2015-08-19 HISTORY — DX: Mixed hyperlipidemia: E78.2

## 2015-08-19 NOTE — Assessment & Plan Note (Signed)
Encouraged heart healthy diet, increase exercise, avoid trans fats, consider a krill oil cap daily 

## 2015-08-19 NOTE — Assessment & Plan Note (Signed)
Encouraged DASH diet, decrease po intake and increase exercise as tolerated. Needs 7-8 hours of sleep nightly. Avoid trans fats, eat small, frequent meals every 4-5 hours with lean proteins, complex carbs and healthy fats. Minimize simple carbs 
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
## 2015-08-19 NOTE — Assessment & Plan Note (Signed)
Antihistamines and nasal steroids.

## 2015-09-10 ENCOUNTER — Encounter: Payer: Self-pay | Admitting: Family Medicine

## 2015-09-10 ENCOUNTER — Other Ambulatory Visit: Payer: Self-pay | Admitting: Family Medicine

## 2015-09-10 MED ORDER — AZITHROMYCIN 250 MG PO TABS: ORAL_TABLET | ORAL | Status: DC

## 2015-09-25 ENCOUNTER — Ambulatory Visit (INDEPENDENT_AMBULATORY_CARE_PROVIDER_SITE_OTHER): Payer: Managed Care, Other (non HMO) | Admitting: Family Medicine

## 2015-09-25 VITALS — BP 133/74 | HR 87

## 2015-09-25 DIAGNOSIS — I10 Essential (primary) hypertension: Secondary | ICD-10-CM | POA: Diagnosis not present

## 2015-09-25 NOTE — Patient Instructions (Addendum)
Per Dr. Abner GreenspanBlyth: Continue current medication regimen and keep follow-up appointment for 11/08/15 at 2:00 PM.

## 2015-09-25 NOTE — Progress Notes (Signed)
RN blood check note reviewed. Agree with documention and plan. 

## 2015-09-25 NOTE — Progress Notes (Signed)
Pre visit review using our clinic review tool, if applicable. No additional management support is needed unless otherwise documented below in the visit note.  Patient presents in clinic for blood pressure check per OV note 08/13/15. Reviewed medications with the patient. Today's reading was BP 133/74 & P 87.  Per Dr. Abner GreenspanBlyth: Continue current medication regimen and keep follow-up appointment for 11/08/15 at 2:00 PM.  RN blood pressure check note reviewed. Agree with documention and plan.

## 2015-09-27 ENCOUNTER — Encounter: Payer: Self-pay | Admitting: Family Medicine

## 2015-09-28 ENCOUNTER — Other Ambulatory Visit: Payer: Self-pay | Admitting: Family Medicine

## 2015-09-28 MED ORDER — CEFDINIR 300 MG PO CAPS: 300.0000 mg | ORAL_CAPSULE | Freq: Two times a day (BID) | ORAL | Status: DC

## 2015-11-08 ENCOUNTER — Ambulatory Visit (INDEPENDENT_AMBULATORY_CARE_PROVIDER_SITE_OTHER): Payer: Managed Care, Other (non HMO) | Admitting: Family Medicine

## 2015-11-08 ENCOUNTER — Encounter: Payer: Self-pay | Admitting: Family Medicine

## 2015-11-08 VITALS — BP 130/82 | HR 73 | Temp 98.3°F | Ht 75.0 in | Wt 240.5 lb

## 2015-11-08 DIAGNOSIS — M62830 Muscle spasm of back: Secondary | ICD-10-CM | POA: Diagnosis not present

## 2015-11-08 DIAGNOSIS — R5383 Other fatigue: Secondary | ICD-10-CM

## 2015-11-08 DIAGNOSIS — E669 Obesity, unspecified: Secondary | ICD-10-CM

## 2015-11-08 DIAGNOSIS — R739 Hyperglycemia, unspecified: Secondary | ICD-10-CM | POA: Diagnosis not present

## 2015-11-08 DIAGNOSIS — N529 Male erectile dysfunction, unspecified: Secondary | ICD-10-CM | POA: Diagnosis not present

## 2015-11-08 DIAGNOSIS — I10 Essential (primary) hypertension: Secondary | ICD-10-CM

## 2015-11-08 DIAGNOSIS — E782 Mixed hyperlipidemia: Secondary | ICD-10-CM

## 2015-11-08 DIAGNOSIS — G4700 Insomnia, unspecified: Secondary | ICD-10-CM

## 2015-11-08 HISTORY — DX: Other fatigue: R53.83

## 2015-11-08 HISTORY — DX: Male erectile dysfunction, unspecified: N52.9

## 2015-11-08 MED ORDER — CYCLOBENZAPRINE HCL 10 MG PO TABS: 10.0000 mg | ORAL_TABLET | Freq: Every evening | ORAL | 1 refills | Status: DC | PRN

## 2015-11-08 MED ORDER — LISINOPRIL 10 MG PO TABS: 10.0000 mg | ORAL_TABLET | Freq: Two times a day (BID) | ORAL | 6 refills | Status: DC

## 2015-11-08 NOTE — Patient Instructions (Signed)
Lidocaine patches to area at bedtime    Back Pain, Adult Back pain is very common in adults.The cause of back pain is rarely dangerous and the pain often gets better over time.The cause of your back pain may not be known. Some common causes of back pain include:  Strain of the muscles or ligaments supporting the spine.  Wear and tear (degeneration) of the spinal disks.  Arthritis.  Direct injury to the back. For many people, back pain may return. Since back pain is rarely dangerous, most people can learn to manage this condition on their own. HOME CARE INSTRUCTIONS Watch your back pain for any changes. The following actions may help to lessen any discomfort you are feeling:  Remain active. It is stressful on your back to sit or stand in one place for long periods of time. Do not sit, drive, or stand in one place for more than 30 minutes at a time. Take short walks on even surfaces as soon as you are able.Try to increase the length of time you walk each day.  Exercise regularly as directed by your health care provider. Exercise helps your back heal faster. It also helps avoid future injury by keeping your muscles strong and flexible.  Do not stay in bed.Resting more than 1-2 days can delay your recovery.  Pay attention to your body when you bend and lift. The most comfortable positions are those that put less stress on your recovering back. Always use proper lifting techniques, including:  Bending your knees.  Keeping the load close to your body.  Avoiding twisting.  Find a comfortable position to sleep. Use a firm mattress and lie on your side with your knees slightly bent. If you lie on your back, put a pillow under your knees.  Avoid feeling anxious or stressed.Stress increases muscle tension and can worsen back pain.It is important to recognize when you are anxious or stressed and learn ways to manage it, such as with exercise.  Take medicines only as directed by your  health care provider. Over-the-counter medicines to reduce pain and inflammation are often the most helpful.Your health care provider may prescribe muscle relaxant drugs.These medicines help dull your pain so you can more quickly return to your normal activities and healthy exercise.  Apply ice to the injured area:  Put ice in a plastic bag.  Place a towel between your skin and the bag.  Leave the ice on for 20 minutes, 2-3 times a day for the first 2-3 days. After that, ice and heat may be alternated to reduce pain and spasms.  Maintain a healthy weight. Excess weight puts extra stress on your back and makes it difficult to maintain good posture. SEEK MEDICAL CARE IF:  You have pain that is not relieved with rest or medicine.  You have increasing pain going down into the legs or buttocks.  You have pain that does not improve in one week.  You have night pain.  You lose weight.  You have a fever or chills. SEEK IMMEDIATE MEDICAL CARE IF:   You develop new bowel or bladder control problems.  You have unusual weakness or numbness in your arms or legs.  You develop nausea or vomiting.  You develop abdominal pain.  You feel faint.   This information is not intended to replace advice given to you by your health care provider. Make sure you discuss any questions you have with your health care provider.   Document Released: 03/17/2005 Document Revised: 04/07/2014  Document Reviewed: 07/19/2013 Elsevier Interactive Patient Education [employer].

## 2015-11-08 NOTE — Progress Notes (Signed)
Pre visit review using our clinic review tool, if applicable. No additional management support is needed unless otherwise documented below in the visit note. 

## 2015-11-09 ENCOUNTER — Encounter: Payer: Self-pay | Admitting: Family Medicine

## 2015-11-09 LAB — COMPREHENSIVE METABOLIC PANEL
ALBUMIN: 4.6 g/dL (ref 3.5–5.2)
ALK PHOS: 62 U/L (ref 39–117)
ALT: 29 U/L (ref 0–53)
AST: 21 U/L (ref 0–37)
BILIRUBIN TOTAL: 0.4 mg/dL (ref 0.2–1.2)
BUN: 21 mg/dL (ref 6–23)
CO2: 27 mEq/L (ref 19–32)
Calcium: 9.6 mg/dL (ref 8.4–10.5)
Chloride: 100 mEq/L (ref 96–112)
Creatinine, Ser: 1.11 mg/dL (ref 0.40–1.50)
GFR: 78.67 mL/min (ref >60.00)
GLUCOSE: 115 mg/dL — AB (ref 70–99)
POTASSIUM: 4.2 meq/L (ref 3.5–5.1)
Sodium: 136 mEq/L (ref 135–145)
TOTAL PROTEIN: 7.5 g/dL (ref 6.0–8.3)

## 2015-11-09 LAB — CBC
HEMATOCRIT: 42.8 % (ref 39.0–52.0)
HEMOGLOBIN: 14.6 g/dL (ref 13.0–17.0)
MCHC: 34 g/dL (ref 30.0–36.0)
MCV: 88 fl (ref 78.0–100.0)
PLATELETS: 244 10*3/uL (ref 150.0–400.0)
RBC: 4.87 Mil/uL (ref 4.22–5.81)
RDW: 12.2 % (ref 11.5–15.5)
WBC: 6 10*3/uL (ref 4.0–10.5)

## 2015-11-09 LAB — HIV ANTIBODY (ROUTINE TESTING W REFLEX): HIV: NONREACTIVE

## 2015-11-09 LAB — TSH: TSH: 1.42 u[IU]/mL (ref 0.35–4.50)

## 2015-11-09 LAB — TESTOSTERONE: Testosterone: 301.65 ng/dL (ref 300.00–890.00)

## 2015-11-09 LAB — HEMOGLOBIN A1C: HEMOGLOBIN A1C: 6.1 % (ref 4.6–6.5)

## 2015-11-13 ENCOUNTER — Ambulatory Visit: Payer: Self-pay | Admitting: Family Medicine

## 2015-11-18 ENCOUNTER — Encounter: Payer: Self-pay | Admitting: Family Medicine

## 2015-11-18 DIAGNOSIS — G4700 Insomnia, unspecified: Secondary | ICD-10-CM

## 2015-11-18 DIAGNOSIS — R739 Hyperglycemia, unspecified: Secondary | ICD-10-CM | POA: Insufficient documentation

## 2015-11-18 DIAGNOSIS — M62830 Muscle spasm of back: Secondary | ICD-10-CM | POA: Insufficient documentation

## 2015-11-18 HISTORY — DX: Insomnia, unspecified: G47.00

## 2015-11-18 NOTE — Assessment & Plan Note (Signed)
Encouraged DASH diet, decrease po intake and increase exercise as tolerated. Needs 7-8 hours of sleep nightly. Avoid trans fats, eat small, frequent meals every 4-5 hours with lean proteins, complex carbs and healthy fats. Minimize simple carbs 

## 2015-11-18 NOTE — Assessment & Plan Note (Signed)
Well controlled, no changes to meds. Encouraged heart healthy diet such as the DASH diet and exercise as tolerated.  °
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
## 2015-11-18 NOTE — Assessment & Plan Note (Signed)
minimize simple carbs. Increase exercise as tolerated.  

## 2015-11-18 NOTE — Assessment & Plan Note (Signed)
Encouraged heart healthy diet, increase exercise, avoid trans fats, consider a krill oil cap daily 

## 2015-11-18 NOTE — Assessment & Plan Note (Signed)
Encouraged good sleep hygiene such as dark, quiet room. No blue/green glowing lights such as computer screens in bedroom. No alcohol or stimulants in evening. Cut down on caffeine as able. Regular exercise is helpful but not just prior to bed time.  

## 2016-02-26 MED FILL — AZITHROMYCIN 500 MG TABLET: 500 | 7 days supply | Qty: 7 | Fill #0

## 2016-02-26 MED FILL — BENZONATATE 200 MG CAPSULE: 200 | 15 days supply | Qty: 45 | Fill #0

## 2016-02-26 MED FILL — predniSONE 10 MG TABS: 10 | 12 days supply | Qty: 48 | Fill #0

## 2016-04-21 MED FILL — AZITHROMYCIN 500 MG TABLET: 500 | 7 days supply | Qty: 7 | Fill #1

## 2016-05-27 ENCOUNTER — Encounter: Payer: Self-pay | Admitting: Family Medicine

## 2016-05-27 ENCOUNTER — Ambulatory Visit (INDEPENDENT_AMBULATORY_CARE_PROVIDER_SITE_OTHER): Payer: Managed Care, Other (non HMO) | Admitting: Family Medicine

## 2016-05-27 VITALS — BP 132/80 | HR 99 | Temp 98.4°F | Resp 16 | Ht 75.0 in | Wt 255.8 lb

## 2016-05-27 DIAGNOSIS — J324 Chronic pansinusitis: Secondary | ICD-10-CM | POA: Diagnosis not present

## 2016-05-27 DIAGNOSIS — I10 Essential (primary) hypertension: Secondary | ICD-10-CM

## 2016-05-27 MED ORDER — FLUTICASONE PROPIONATE 50 MCG/ACT NA SUSP: 2.0000 | Freq: Every day | NASAL | 6 refills | Status: DC

## 2016-05-27 MED ORDER — AMOXICILLIN-POT CLAVULANATE 875-125 MG PO TABS: 1.0000 | ORAL_TABLET | Freq: Two times a day (BID) | ORAL | 0 refills | Status: DC

## 2016-05-27 MED ORDER — LISINOPRIL 10 MG PO TABS: 10.0000 mg | ORAL_TABLET | Freq: Two times a day (BID) | ORAL | 0 refills | Status: DC
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
## 2016-05-27 NOTE — Progress Notes (Signed)
Pre visit review using our clinic review tool, if applicable. No additional management support is needed unless otherwise documented below in the visit note. 

## 2016-05-27 NOTE — Patient Instructions (Signed)

## 2016-06-06 ENCOUNTER — Other Ambulatory Visit: Payer: Self-pay | Admitting: Family Medicine

## 2016-06-19 ENCOUNTER — Other Ambulatory Visit: Payer: Self-pay | Admitting: Family Medicine

## 2016-06-19 ENCOUNTER — Encounter: Payer: Self-pay | Admitting: Family Medicine

## 2016-06-19 MED ORDER — LISINOPRIL 10 MG PO TABS: 10.0000 mg | ORAL_TABLET | Freq: Two times a day (BID) | ORAL | 2 refills | Status: DC

## 2016-07-23 ENCOUNTER — Encounter: Payer: Self-pay | Admitting: Podiatry

## 2016-07-23 ENCOUNTER — Ambulatory Visit (INDEPENDENT_AMBULATORY_CARE_PROVIDER_SITE_OTHER): Payer: Managed Care, Other (non HMO) | Admitting: Podiatry

## 2016-07-23 ENCOUNTER — Ambulatory Visit (INDEPENDENT_AMBULATORY_CARE_PROVIDER_SITE_OTHER): Payer: Managed Care, Other (non HMO)

## 2016-07-23 VITALS — BP 149/91 | HR 71

## 2016-07-23 DIAGNOSIS — M722 Plantar fascial fibromatosis: Secondary | ICD-10-CM

## 2016-07-23 MED ORDER — DICLOFENAC SODIUM 75 MG PO TBEC: 75.0000 mg | DELAYED_RELEASE_TABLET | Freq: Two times a day (BID) | ORAL | 2 refills | Status: DC

## 2016-07-23 MED ORDER — TRIAMCINOLONE ACETONIDE 10 MG/ML IJ SUSP
10.0000 mg | Freq: Once | INTRAMUSCULAR | Status: AC
  Administered 2016-07-23: 10 mg

## 2016-07-23 NOTE — Progress Notes (Signed)
Subjective:    Patient ID: Michael Gallagher, male   DOB: 41 y.o.   MRN: [id number]   HPI patient states that he has had heel pain since last August States it's been getting worse gradually over the last few months    Review of Systems  All other systems reviewed and are negative.       Objective:  Physical Exam  Constitutional: He is oriented to person, place, and time.  Cardiovascular: Intact distal pulses.   Musculoskeletal: Normal range of motion.  Neurological: He is alert and oriented to person, place, and time.  Skin: Skin is warm.  Nursing note and vitals reviewed.  neurovascular status found to be intact muscle strength adequate range of motion within normal limits with patient found to have exquisite discomfort in the plantar aspect of the right heel at the insertional point tendon into the calcaneus and slightly distal. States it's been bad and getting worse gradually as time goes on     Assessment:    Acute plantar fasciitis right with inflammation fluid around the medial band along with significant flatfoot deformity     Plan:    H&P condition reviewed with patient and today I went ahead and I injected the plantar fascia 3 mg Kenalog 5 mill grams Xylocaine and applied fascial brace gave instructions on physical therapy and placed on diclofenac 75 mg twice a day. Discussed long-term orthotics and he will have these made once we have this under better control

## 2016-07-23 NOTE — Patient Instructions (Signed)

## 2016-07-23 NOTE — Progress Notes (Signed)
   Subjective:    Patient ID: Michael Gallagher, male    DOB: 12-15-1975, 41 y.o.   MRN: [id number]  HPI    Review of Systems  All other systems reviewed and are negative.      Objective:   Physical Exam        Assessment & Plan:

## 2016-07-29 ENCOUNTER — Ambulatory Visit (INDEPENDENT_AMBULATORY_CARE_PROVIDER_SITE_OTHER): Payer: Managed Care, Other (non HMO) | Admitting: Family Medicine

## 2016-07-29 ENCOUNTER — Encounter: Payer: Self-pay | Admitting: Family Medicine

## 2016-07-29 VITALS — BP 136/76 | HR 79 | Temp 98.3°F | Wt 245.8 lb

## 2016-07-29 DIAGNOSIS — R739 Hyperglycemia, unspecified: Secondary | ICD-10-CM | POA: Diagnosis not present

## 2016-07-29 DIAGNOSIS — N529 Male erectile dysfunction, unspecified: Secondary | ICD-10-CM

## 2016-07-29 DIAGNOSIS — I10 Essential (primary) hypertension: Secondary | ICD-10-CM

## 2016-07-29 DIAGNOSIS — G8929 Other chronic pain: Secondary | ICD-10-CM

## 2016-07-29 DIAGNOSIS — E782 Mixed hyperlipidemia: Secondary | ICD-10-CM | POA: Diagnosis not present

## 2016-07-29 DIAGNOSIS — N200 Calculus of kidney: Secondary | ICD-10-CM

## 2016-07-29 DIAGNOSIS — M25511 Pain in right shoulder: Secondary | ICD-10-CM

## 2016-07-29 DIAGNOSIS — M25512 Pain in left shoulder: Secondary | ICD-10-CM

## 2016-07-29 DIAGNOSIS — E6609 Other obesity due to excess calories: Secondary | ICD-10-CM

## 2016-07-29 HISTORY — DX: Pain in left shoulder: M25.511

## 2016-07-29 MED ORDER — LISINOPRIL 10 MG PO TABS: 10.0000 mg | ORAL_TABLET | Freq: Two times a day (BID) | ORAL | 5 refills | Status: DC
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
## 2016-07-29 NOTE — Assessment & Plan Note (Signed)
minimize simple carbs. Increase exercise as tolerated.  

## 2016-07-29 NOTE — Assessment & Plan Note (Signed)
Low normal testosterone check prior to next visit

## 2016-07-29 NOTE — Progress Notes (Signed)
Pre visit review using our clinic review tool, if applicable. No additional management support is needed unless otherwise documented below in the visit note. 

## 2016-07-29 NOTE — Assessment & Plan Note (Signed)
Well controlled, no changes to meds. Encouraged heart healthy diet such as the DASH diet and exercise as tolerated.  °

## 2016-07-29 NOTE — Patient Instructions (Signed)
MIND  Carbohydrate Counting for Diabetes Mellitus, Adult Carbohydrate counting is a method for keeping track of how many carbohydrates you eat. Eating carbohydrates naturally increases the amount of sugar (glucose) in the blood. Counting how many carbohydrates you eat helps keep your blood glucose within normal limits, which helps you manage your diabetes (diabetes mellitus). It is important to know how many carbohydrates you can safely have in each meal. This is different for every person. A diet and nutrition specialist (registered dietitian) can help you make a meal plan and calculate how many carbohydrates you should have at each meal and snack. Carbohydrates are found in the following foods:  Grains, such as breads and cereals.  Dried beans and soy products.  Starchy vegetables, such as potatoes, peas, and corn.  Fruit and fruit juices.  Milk and yogurt.  Sweets and snack foods, such as cake, cookies, candy, chips, and soft drinks. How do I count carbohydrates? There are two ways to count carbohydrates in food. You can use either of the methods or a combination of both. Reading "Nutrition Facts" on packaged food  The "Nutrition Facts" list is included on the labels of almost all packaged foods and beverages in the [place] It includes:  The serving size.  Information about nutrients in each serving, including the grams (g) of carbohydrate per serving. To use the "Nutrition Facts":  Decide how many servings you will have.  Multiply the number of servings by the number of carbohydrates per serving.  The resulting number is the total amount of carbohydrates that you will be having. Learning standard serving sizes of other foods  When you eat foods containing carbohydrates that are not packaged or do not include "Nutrition Facts" on the label, you need to measure the servings in order to count the amount of carbohydrates:  Measure the foods that you will eat with a food scale or  measuring cup, if needed.  Decide how many standard-size servings you will eat.  Multiply the number of servings by 15. Most carbohydrate-rich foods have about 15 g of carbohydrates per serving.  For example, if you eat 8 oz (170 g) of strawberries, you will have eaten 2 servings and 30 g of carbohydrates (2 servings x 15 g = 30 g).  For foods that have more than one food mixed, such as soups and casseroles, you must count the carbohydrates in each food that is included. The following list contains standard serving sizes of common carbohydrate-rich foods. Each of these servings has about 15 g of carbohydrates:   hamburger bun or  English muffin.   oz (15 mL) syrup.   oz (14 g) jelly.  1 slice of bread.  1 six-inch tortilla.  3 oz (85 g) cooked rice or pasta.  4 oz (113 g) cooked dried beans.  4 oz (113 g) starchy vegetable, such as peas, corn, or potatoes.  4 oz (113 g) hot cereal.  4 oz (113 g) mashed potatoes or  of a large baked potato.  4 oz (113 g) canned or frozen fruit.  4 oz (120 mL) fruit juice.  4-6 crackers.  6 chicken nuggets.  6 oz (170 g) unsweetened dry cereal.  6 oz (170 g) plain fat-free yogurt or yogurt sweetened with artificial sweeteners.  8 oz (240 mL) milk.  8 oz (170 g) fresh fruit or one small piece of fruit.  24 oz (680 g) popped popcorn. Example of carbohydrate counting Sample meal   3 oz (85 g) chicken breast.  6 oz (170 g) brown rice.  4 oz (113 g) corn.  8 oz (240 mL) milk.  8 oz (170 g) strawberries with sugar-free whipped topping. Carbohydrate calculation  1. Identify the foods that contain carbohydrates:  Rice.  Corn.  Milk.  Strawberries. 2. Calculate how many servings you have of each food:  2 servings rice.  1 serving corn.  1 serving milk.  1 serving strawberries. 3. Multiply each number of servings by 15 g:  2 servings rice x 15 g = 30 g.  1 serving corn x 15 g = 15 g.  1 serving milk x 15 g  = 15 g.  1 serving strawberries x 15 g = 15 g. 4. Add together all of the amounts to find the total grams of carbohydrates eaten:  30 g + 15 g + 15 g + 15 g = 75 g of carbohydrates total. This information is not intended to replace advice given to you by your health care provider. Make sure you discuss any questions you have with your health care provider. Document Released: 03/17/2005 Document Revised: 10/05/2015 Document Reviewed: 08/29/2015 Elsevier Interactive Patient Education  2017 [employer].

## 2016-07-30 NOTE — Assessment & Plan Note (Signed)
hgba1c acceptable, minimize simple carbs. Increase exercise as tolerated.  

## 2016-07-30 NOTE — Assessment & Plan Note (Signed)
Has passed a couple stones since last visit but denies any acute concerns.

## 2016-07-30 NOTE — Assessment & Plan Note (Signed)
Encouraged DASH diet, decrease po intake and increase exercise as tolerated. Needs 7-8 hours of sleep nightly. Avoid trans fats, eat small, frequent meals every 4-5 hours with lean proteins, complex carbs and healthy fats. Minimize simple carbs, consider bariatric consultation 

## 2016-08-06 ENCOUNTER — Ambulatory Visit: Payer: Managed Care, Other (non HMO) | Admitting: Podiatry

## 2016-08-20 ENCOUNTER — Ambulatory Visit: Payer: Self-pay | Admitting: Family Medicine
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
## 2016-09-15 ENCOUNTER — Ambulatory Visit: Payer: Managed Care, Other (non HMO) | Admitting: Family Medicine

## 2016-10-07 ENCOUNTER — Ambulatory Visit: Payer: Self-pay | Admitting: Nurse Practitioner

## 2017-01-19 ENCOUNTER — Encounter: Payer: Self-pay | Admitting: Family Medicine

## 2017-01-19 ENCOUNTER — Other Ambulatory Visit: Payer: Self-pay | Admitting: Family Medicine

## 2017-01-19 DIAGNOSIS — I10 Essential (primary) hypertension: Secondary | ICD-10-CM

## 2017-02-10 ENCOUNTER — Ambulatory Visit: Payer: Managed Care, Other (non HMO) | Admitting: Family Medicine

## 2017-02-10 ENCOUNTER — Encounter: Payer: Self-pay | Admitting: Family Medicine

## 2017-02-10 VITALS — BP 141/87 | HR 72 | Temp 98.3°F | Resp 16 | Ht 75.0 in | Wt 255.4 lb

## 2017-02-10 DIAGNOSIS — R300 Dysuria: Secondary | ICD-10-CM | POA: Diagnosis not present

## 2017-02-10 DIAGNOSIS — N23 Unspecified renal colic: Secondary | ICD-10-CM

## 2017-02-10 DIAGNOSIS — R1030 Lower abdominal pain, unspecified: Secondary | ICD-10-CM

## 2017-02-10 DIAGNOSIS — I10 Essential (primary) hypertension: Secondary | ICD-10-CM | POA: Diagnosis not present

## 2017-02-10 LAB — POC URINALSYSI DIPSTICK (AUTOMATED)
Bilirubin, UA: NEGATIVE
Glucose, UA: NEGATIVE
Ketones, UA: NEGATIVE
Leukocytes, UA: NEGATIVE
NITRITE UA: NEGATIVE
PH UA: 7 (ref 5.0–8.0)
PROTEIN UA: NEGATIVE
SPEC GRAV UA: 1.015 (ref 1.010–1.025)
UROBILINOGEN UA: 0.2 U/dL

## 2017-02-10 MED ORDER — HYDROCODONE-ACETAMINOPHEN 5-325 MG PO TABS: 1.0000 | ORAL_TABLET | Freq: Four times a day (QID) | ORAL | 0 refills | Status: DC | PRN

## 2017-02-10 MED ORDER — LISINOPRIL 10 MG PO TABS: ORAL_TABLET | ORAL | 0 refills | Status: DC

## 2017-02-10 MED ORDER — TAMSULOSIN HCL 0.4 MG PO CAPS: 0.4000 mg | ORAL_CAPSULE | Freq: Every day | ORAL | 3 refills | Status: DC

## 2017-02-10 NOTE — Patient Instructions (Signed)
Kidney Stones  Kidney stones (urolithiasis) are solid, rock-like deposits that form inside of the organs that make urine (kidneys). A kidney stone may form in a kidney and move into the bladder, where it can cause intense pain and block the flow of urine. Kidney stones are created when high levels of certain minerals are found in the urine. They are usually passed through urination, but in some cases, medical treatment may be needed to remove them.  What are the causes?  Kidney stones may be caused by:  · A condition in which certain glands produce too much parathyroid hormone (primary hyperparathyroidism), which causes too much calcium buildup in the blood.  · Buildup of uric acid crystals in the bladder (hyperuricosuria). Uric acid is a chemical that the body produces when you eat certain foods. It usually exits the body in the urine.  · Narrowing (stricture) of one or both of the tubes that drain urine from the kidneys to the bladder (ureters).  · A kidney blockage that is present at birth (congenital obstruction).  · Past surgery on the kidney or the ureters, such as gastric bypass surgery.    What increases the risk?  The following factors make you more likely to develop kidney stones:  · Having had a kidney stone in the past.  · Having a family history of kidney stones.  · Not drinking enough water.  · Eating a diet that is high in protein, salt (sodium), or sugar.  · Being overweight or obese.    What are the signs or symptoms?  Symptoms of a kidney stone may include:  · Nausea.  · Vomiting.  · Blood in the urine (hematuria).  · Pain in the side of the abdomen, right below the ribs (flank pain). Pain usually spreads (radiates) to the groin.  · Needing to urinate frequently or urgently.    How is this diagnosed?  This condition may be diagnosed based on:  · Your medical history.  · A physical exam.  · Blood tests.  · Urine tests.  · CT scan.  · Abdominal X-ray.  · A procedure to examine the inside of the  bladder (cystoscopy).    How is this treated?  Treatment for kidney stones depends on the size, location, and makeup of the stones. Treatment may involve:  · Analyzing your urine before and after you pass the stone through urination.  · Being monitored at the hospital until you pass the stone through urination.  · Increasing your fluid intake and decreasing the amount of calcium and protein in your diet.  · A procedure to break up kidney stones in the bladder using:  ? A focused beam of light (laser therapy).  ? Shock waves (extracorporeal shock wave lithotripsy).  · Surgery to remove kidney stones. This may be needed if you have severe pain or have stones that block your urinary tract.    Follow these instructions at home:  Eating and drinking     · Drink enough fluid to keep your urine clear or pale yellow. This will help you to pass the kidney stone.  · If directed, change your diet. This may include:  ? Limiting how much sodium you eat.  ? Eating more fruits and vegetables.  ? Limiting how much meat, poultry, fish, and eggs you eat.  · Follow instructions from your health care provider about eating or drinking restrictions.  General instructions   · Collect urine samples as told by your health care   provider. You may need to collect a urine sample:  ? 24 hours after you pass the stone.  ? 8-12 weeks after passing the kidney stone, and every 6-12 months after that.  · Strain your urine every time you urinate, for as long as directed. Use the strainer that your health care provider recommends.  · Do not throw out the kidney stone after passing it. Keep the stone so it can be tested by your health care provider. Testing the makeup of your kidney stone may help prevent you from getting kidney stones in the future.  · Take over-the-counter and prescription medicines only as told by your health care provider.  · Keep all follow-up visits as told by your health care provider. This is important. You may need follow-up  X-rays or ultrasounds to make sure that your stone has passed.  How is this prevented?  To prevent another kidney stone:  · Drink enough fluid to keep your urine clear or pale yellow. This is the best way to prevent kidney stones.  · Eat a healthy diet and follow recommendations from your health care provider about foods to avoid. You may be instructed to eat a low-protein diet. Recommendations vary depending on the type of kidney stone that you have.  · Maintain a healthy weight.    Contact a health care provider if:  · You have pain that gets worse or does not get better with medicine.  Get help right away if:  · You have a fever or chills.  · You develop severe pain.  · You develop new abdominal pain.  · You faint.  · You are unable to urinate.  This information is not intended to replace advice given to you by your health care provider. Make sure you discuss any questions you have with your health care provider.  Document Released: 03/17/2005 Document Revised: 10/05/2015 Document Reviewed: 08/31/2015  Elsevier Interactive Patient Education © 2017 [employer]
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
## 2017-02-11 ENCOUNTER — Ambulatory Visit (HOSPITAL_BASED_OUTPATIENT_CLINIC_OR_DEPARTMENT_OTHER)
Admission: RE | Admit: 2017-02-11 | Discharge: 2017-02-11 | Disposition: A | Discharge status: Home / Self Care | Payer: Managed Care, Other (non HMO) | Source: Ambulatory Visit | Attending: Family Medicine | Admitting: Family Medicine

## 2017-02-11 DIAGNOSIS — R1030 Lower abdominal pain, unspecified: Secondary | ICD-10-CM | POA: Diagnosis not present

## 2017-02-11 DIAGNOSIS — N200 Calculus of kidney: Secondary | ICD-10-CM | POA: Diagnosis not present

## 2017-02-11 DIAGNOSIS — Z9049 Acquired absence of other specified parts of digestive tract: Secondary | ICD-10-CM | POA: Diagnosis not present

## 2017-02-12 ENCOUNTER — Encounter: Payer: Self-pay | Admitting: Family Medicine

## 2017-02-12 NOTE — Telephone Encounter (Signed)
There was a stone but it was not obstructing anything--- if pain con't---  Refer to urology

## 2017-02-12 NOTE — Telephone Encounter (Signed)
Pt called to inquire about ct results. Would like call back  Please advise CB: [phone]

## 2017-02-19 ENCOUNTER — Other Ambulatory Visit: Payer: Self-pay | Admitting: Family Medicine

## 2017-02-19 DIAGNOSIS — I10 Essential (primary) hypertension: Secondary | ICD-10-CM

## 2017-03-17 ENCOUNTER — Encounter: Payer: Self-pay | Admitting: Family Medicine

## 2017-03-17 DIAGNOSIS — I10 Essential (primary) hypertension: Secondary | ICD-10-CM

## 2017-03-17 MED ORDER — LISINOPRIL 10 MG PO TABS: ORAL_TABLET | ORAL | 1 refills | Status: DC

## 2017-03-30 ENCOUNTER — Ambulatory Visit: Payer: Self-pay | Admitting: Family Medicine

## 2017-04-14 ENCOUNTER — Ambulatory Visit: Payer: Self-pay | Admitting: Family Medicine

## 2017-04-22 ENCOUNTER — Encounter: Payer: Self-pay | Admitting: Family Medicine

## 2017-04-22 DIAGNOSIS — E782 Mixed hyperlipidemia: Secondary | ICD-10-CM

## 2017-04-22 DIAGNOSIS — R739 Hyperglycemia, unspecified: Secondary | ICD-10-CM

## 2017-04-22 DIAGNOSIS — I10 Essential (primary) hypertension: Secondary | ICD-10-CM

## 2017-04-22 DIAGNOSIS — N529 Male erectile dysfunction, unspecified: Secondary | ICD-10-CM

## 2017-04-29 NOTE — Telephone Encounter (Signed)
Lab orders entered and message sent to pt.   Bradd CanaryBlyth, Stacey A, MD  Sent: Wed April 29, 2017 2:31 PM  To: Michael Gallagher, Michael Gallagher, CMA  Cc: Michael Gallagher, Michael Gallagher, RMA      Message   Please order testosterone for ED, cbc, cmp and tsh for htn, hgba1c for hyperglycemia and [facility] the week prior to his appt and let him know

## 2017-05-05 ENCOUNTER — Ambulatory Visit: Payer: Self-pay | Admitting: Family Medicine

## 2017-05-07 ENCOUNTER — Encounter: Payer: Self-pay | Admitting: Family Medicine

## 2017-05-10 ENCOUNTER — Other Ambulatory Visit: Payer: Self-pay | Admitting: Family Medicine

## 2017-05-10 DIAGNOSIS — I10 Essential (primary) hypertension: Secondary | ICD-10-CM

## 2017-05-18 ENCOUNTER — Other Ambulatory Visit (INDEPENDENT_AMBULATORY_CARE_PROVIDER_SITE_OTHER): Payer: Managed Care, Other (non HMO)

## 2017-05-18 DIAGNOSIS — E782 Mixed hyperlipidemia: Secondary | ICD-10-CM

## 2017-05-18 DIAGNOSIS — R739 Hyperglycemia, unspecified: Secondary | ICD-10-CM

## 2017-05-18 DIAGNOSIS — I10 Essential (primary) hypertension: Secondary | ICD-10-CM | POA: Diagnosis not present

## 2017-05-18 DIAGNOSIS — N529 Male erectile dysfunction, unspecified: Secondary | ICD-10-CM | POA: Diagnosis not present

## 2017-05-18 LAB — LIPID PANEL
CHOL/HDL RATIO: 6
CHOLESTEROL: 243 mg/dL — AB (ref 0–200)
HDL: 42 mg/dL (ref >39.00)
NonHDL: 201.16
Triglycerides: 213 mg/dL — ABNORMAL HIGH (ref 0.0–149.0)
VLDL: 42.6 mg/dL — ABNORMAL HIGH (ref 0.0–40.0)

## 2017-05-18 LAB — COMPREHENSIVE METABOLIC PANEL
ALK PHOS: 55 U/L (ref 39–117)
ALT: 41 U/L (ref 0–53)
AST: 31 U/L (ref 0–37)
Albumin: 4.4 g/dL (ref 3.5–5.2)
BUN: 16 mg/dL (ref 6–23)
CHLORIDE: 101 meq/L (ref 96–112)
CO2: 27 mEq/L (ref 19–32)
Calcium: 8.9 mg/dL (ref 8.4–10.5)
Creatinine, Ser: 1.02 mg/dL (ref 0.40–1.50)
GFR: 86.05 mL/min (ref >60.00)
GLUCOSE: 110 mg/dL — AB (ref 70–99)
POTASSIUM: 4.1 meq/L (ref 3.5–5.1)
Sodium: 136 mEq/L (ref 135–145)
TOTAL PROTEIN: 7.2 g/dL (ref 6.0–8.3)
Total Bilirubin: 0.5 mg/dL (ref 0.2–1.2)

## 2017-05-18 LAB — CBC
HEMATOCRIT: 42 % (ref 39.0–52.0)
HEMOGLOBIN: 14.5 g/dL (ref 13.0–17.0)
MCHC: 34.5 g/dL (ref 30.0–36.0)
MCV: 87.6 fl (ref 78.0–100.0)
Platelets: 291 10*3/uL (ref 150.0–400.0)
RBC: 4.8 Mil/uL (ref 4.22–5.81)
RDW: 12.7 % (ref 11.5–15.5)
WBC: 8.7 10*3/uL (ref 4.0–10.5)

## 2017-05-18 LAB — TESTOSTERONE: Testosterone: 285.32 ng/dL — ABNORMAL LOW (ref 300.00–890.00)

## 2017-05-18 LAB — TSH: TSH: 2.1 u[IU]/mL (ref 0.35–4.50)

## 2017-05-18 LAB — HEMOGLOBIN A1C: Hgb A1c MFr Bld: 6.4 % (ref 4.6–6.5)

## 2017-05-18 LAB — LDL CHOLESTEROL, DIRECT: Direct LDL: 168 mg/dL

## 2017-05-25 ENCOUNTER — Ambulatory Visit: Payer: Managed Care, Other (non HMO) | Admitting: Family Medicine

## 2017-05-25 ENCOUNTER — Encounter: Payer: Self-pay | Admitting: Family Medicine

## 2017-05-25 VITALS — BP 142/86 | HR 96 | Temp 98.2°F | Resp 18 | Wt 257.4 lb

## 2017-05-25 DIAGNOSIS — R519 Headache, unspecified: Secondary | ICD-10-CM | POA: Insufficient documentation

## 2017-05-25 DIAGNOSIS — R739 Hyperglycemia, unspecified: Secondary | ICD-10-CM | POA: Diagnosis not present

## 2017-05-25 DIAGNOSIS — I10 Essential (primary) hypertension: Secondary | ICD-10-CM | POA: Diagnosis not present

## 2017-05-25 DIAGNOSIS — E6609 Other obesity due to excess calories: Secondary | ICD-10-CM

## 2017-05-25 DIAGNOSIS — E782 Mixed hyperlipidemia: Secondary | ICD-10-CM

## 2017-05-25 DIAGNOSIS — R51 Headache: Secondary | ICD-10-CM

## 2017-05-25 DIAGNOSIS — R7989 Other specified abnormal findings of blood chemistry: Secondary | ICD-10-CM | POA: Diagnosis not present

## 2017-05-25 DIAGNOSIS — G44209 Tension-type headache, unspecified, not intractable: Secondary | ICD-10-CM | POA: Diagnosis not present

## 2017-05-25 MED ORDER — LISINOPRIL 20 MG PO TABS: 20.0000 mg | ORAL_TABLET | Freq: Two times a day (BID) | ORAL | 1 refills | Status: DC

## 2017-05-25 MED ORDER — LISINOPRIL 10 MG PO TABS: 10.0000 mg | ORAL_TABLET | Freq: Two times a day (BID) | ORAL | 0 refills | Status: DC

## 2017-05-25 NOTE — Assessment & Plan Note (Signed)
Well controlled, no changes to meds. Encouraged heart healthy diet such as the DASH diet and exercise as tolerated.  °

## 2017-05-25 NOTE — Assessment & Plan Note (Signed)
Encouraged DASH diet, decrease po intake and increase exercise as tolerated. Needs 7-8 hours of sleep nightly. Avoid trans fats, eat small, frequent meals every 4-5 hours with lean proteins, complex carbs and healthy fats. Minimize simple carbs, bariatric referral 
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
## 2017-05-25 NOTE — Patient Instructions (Signed)
Dash or MIND diet  Carbohydrate Counting for Diabetes Mellitus, Adult Carbohydrate counting is a method for keeping track of how many carbohydrates you eat. Eating carbohydrates naturally increases the amount of sugar (glucose) in the blood. Counting how many carbohydrates you eat helps keep your blood glucose within normal limits, which helps you manage your diabetes (diabetes mellitus). It is important to know how many carbohydrates you can safely have in each meal. This is different for every person. A diet and nutrition specialist (registered dietitian) can help you make a meal plan and calculate how many carbohydrates you should have at each meal and snack. Carbohydrates are found in the following foods:  Grains, such as breads and cereals.  Dried beans and soy products.  Starchy vegetables, such as potatoes, peas, and corn.  Fruit and fruit juices.  Milk and yogurt.  Sweets and snack foods, such as cake, cookies, candy, chips, and soft drinks.  How do I count carbohydrates? There are two ways to count carbohydrates in food. You can use either of the methods or a combination of both. Reading "Nutrition Facts" on packaged food The "Nutrition Facts" list is included on the labels of almost all packaged foods and beverages in the [place] It includes:  The serving size.  Information about nutrients in each serving, including the grams (g) of carbohydrate per serving.  To use the "Nutrition Facts":  Decide how many servings you will have.  Multiply the number of servings by the number of carbohydrates per serving.  The resulting number is the total amount of carbohydrates that you will be having.  Learning standard serving sizes of other foods When you eat foods containing carbohydrates that are not packaged or do not include "Nutrition Facts" on the label, you need to measure the servings in order to count the amount of carbohydrates:  Measure the foods that you will eat with a  food scale or measuring cup, if needed.  Decide how many standard-size servings you will eat.  Multiply the number of servings by 15. Most carbohydrate-rich foods have about 15 g of carbohydrates per serving. ? For example, if you eat 8 oz (170 g) of strawberries, you will have eaten 2 servings and 30 g of carbohydrates (2 servings x 15 g = 30 g).  For foods that have more than one food mixed, such as soups and casseroles, you must count the carbohydrates in each food that is included.  The following list contains standard serving sizes of common carbohydrate-rich foods. Each of these servings has about 15 g of carbohydrates:   hamburger bun or  English muffin.   oz (15 mL) syrup.   oz (14 g) jelly.  1 slice of bread.  1 six-inch tortilla.  3 oz (85 g) cooked rice or pasta.  4 oz (113 g) cooked dried beans.  4 oz (113 g) starchy vegetable, such as peas, corn, or potatoes.  4 oz (113 g) hot cereal.  4 oz (113 g) mashed potatoes or  of a large baked potato.  4 oz (113 g) canned or frozen fruit.  4 oz (120 mL) fruit juice.  4-6 crackers.  6 chicken nuggets.  6 oz (170 g) unsweetened dry cereal.  6 oz (170 g) plain fat-free yogurt or yogurt sweetened with artificial sweeteners.  8 oz (240 mL) milk.  8 oz (170 g) fresh fruit or one small piece of fruit.  24 oz (680 g) popped popcorn.  Example of carbohydrate counting Sample meal  3  oz (85 g) chicken breast.  6 oz (170 g) brown rice.  4 oz (113 g) corn.  8 oz (240 mL) milk.  8 oz (170 g) strawberries with sugar-free whipped topping. Carbohydrate calculation 1. Identify the foods that contain carbohydrates: ? Rice. ? Corn. ? Milk. ? Strawberries. 2. Calculate how many servings you have of each food: ? 2 servings rice. ? 1 serving corn. ? 1 serving milk. ? 1 serving strawberries. 3. Multiply each number of servings by 15 g: ? 2 servings rice x 15 g = 30 g. ? 1 serving corn x 15 g = 15 g. ? 1  serving milk x 15 g = 15 g. ? 1 serving strawberries x 15 g = 15 g. 4. Add together all of the amounts to find the total grams of carbohydrates eaten: ? 30 g + 15 g + 15 g + 15 g = 75 g of carbohydrates total. This information is not intended to replace advice given to you by your health care provider. Make sure you discuss any questions you have with your health care provider. Document Released: 03/17/2005 Document Revised: 10/05/2015 Document Reviewed: 08/29/2015 Elsevier Interactive Patient Education  [employer].

## 2017-05-25 NOTE — Assessment & Plan Note (Signed)
Encouraged heart healthy diet, increase exercise, avoid trans fats, consider a krill oil cap daily 

## 2017-05-25 NOTE — Assessment & Plan Note (Signed)
Mild, continue to monitor and recheck with next blood

## 2017-05-25 NOTE — Assessment & Plan Note (Signed)
Encouraged increased hydration, 64 ounces of clear fluids daily. Minimize alcohol and caffeine. Eat small frequent meals with lean proteins and complex carbs. Avoid high and low blood sugars. Get adequate sleep, 7-8 hours a night. Needs exercise daily preferably in the morning. If visual spots occur more often and persist let us know and we will refer to opthamology for further consideration

## 2017-06-03 ENCOUNTER — Ambulatory Visit (INDEPENDENT_AMBULATORY_CARE_PROVIDER_SITE_OTHER): Payer: Managed Care, Other (non HMO) | Admitting: Family Medicine

## 2017-06-03 VITALS — BP 129/87 | HR 86

## 2017-06-03 DIAGNOSIS — I10 Essential (primary) hypertension: Secondary | ICD-10-CM

## 2017-06-03 NOTE — Progress Notes (Signed)
Pre visit review using our clinic review tool, if applicable. No additional management support is needed unless otherwise documented below in the visit note.  Patient's Lisinopril was increased from 10mg  twice a day to 20 mg twice a day.  BP today is 129/87.  BP Readings from Last 3 Encounters:  05/25/17 (!) 142/86  02/10/17 (!) 141/87  07/29/16 136/76   Per covering provider, Dr. Patsy Lageropland, ok to continue 20 mg twice a day and to keep appointment with Dr. Abner GreenspanBlyth on 08-18-17. I have reviewed documentation as above and agree- Arthor CaptainJ Copland MD

## 2017-08-17 ENCOUNTER — Other Ambulatory Visit (INDEPENDENT_AMBULATORY_CARE_PROVIDER_SITE_OTHER): Payer: Managed Care, Other (non HMO)

## 2017-08-17 DIAGNOSIS — I10 Essential (primary) hypertension: Secondary | ICD-10-CM

## 2017-08-17 DIAGNOSIS — R7989 Other specified abnormal findings of blood chemistry: Secondary | ICD-10-CM | POA: Diagnosis not present

## 2017-08-17 DIAGNOSIS — E782 Mixed hyperlipidemia: Secondary | ICD-10-CM

## 2017-08-17 DIAGNOSIS — R739 Hyperglycemia, unspecified: Secondary | ICD-10-CM | POA: Diagnosis not present

## 2017-08-17 LAB — COMPREHENSIVE METABOLIC PANEL
ALBUMIN: 4.6 g/dL (ref 3.5–5.2)
ALT: 31 U/L (ref 0–53)
AST: 21 U/L (ref 0–37)
Alkaline Phosphatase: 56 U/L (ref 39–117)
BUN: 24 mg/dL — AB (ref 6–23)
CHLORIDE: 102 meq/L (ref 96–112)
CO2: 26 mEq/L (ref 19–32)
CREATININE: 1.12 mg/dL (ref 0.40–1.50)
Calcium: 9.2 mg/dL (ref 8.4–10.5)
GFR: 77.15 mL/min (ref >60.00)
GLUCOSE: 120 mg/dL — AB (ref 70–99)
Potassium: 4.1 mEq/L (ref 3.5–5.1)
SODIUM: 137 meq/L (ref 135–145)
Total Bilirubin: 0.4 mg/dL (ref 0.2–1.2)
Total Protein: 7.6 g/dL (ref 6.0–8.3)

## 2017-08-17 LAB — TSH: TSH: 1.89 u[IU]/mL (ref 0.35–4.50)

## 2017-08-17 LAB — CBC
HEMATOCRIT: 44.3 % (ref 39.0–52.0)
Hemoglobin: 14.9 g/dL (ref 13.0–17.0)
MCHC: 33.6 g/dL (ref 30.0–36.0)
MCV: 89 fl (ref 78.0–100.0)
PLATELETS: 262 10*3/uL (ref 150.0–400.0)
RBC: 4.97 Mil/uL (ref 4.22–5.81)
RDW: 12.2 % (ref 11.5–15.5)
WBC: 8.6 10*3/uL (ref 4.0–10.5)

## 2017-08-17 LAB — LIPID PANEL
CHOLESTEROL: 250 mg/dL — AB (ref 0–200)
HDL: 42.3 mg/dL (ref >39.00)
NonHDL: 207.94
Total CHOL/HDL Ratio: 6
Triglycerides: 278 mg/dL — ABNORMAL HIGH (ref 0.0–149.0)
VLDL: 55.6 mg/dL — ABNORMAL HIGH (ref 0.0–40.0)

## 2017-08-17 LAB — TESTOSTERONE: TESTOSTERONE: 188.48 ng/dL — AB (ref 300.00–890.00)

## 2017-08-17 LAB — LDL CHOLESTEROL, DIRECT: Direct LDL: 176 mg/dL

## 2017-08-17 LAB — HEMOGLOBIN A1C: HEMOGLOBIN A1C: 6.4 % (ref 4.6–6.5)

## 2017-08-18 ENCOUNTER — Telehealth: Payer: Self-pay

## 2017-08-18 ENCOUNTER — Encounter: Payer: Self-pay | Admitting: Family Medicine

## 2017-08-18 ENCOUNTER — Ambulatory Visit: Payer: Managed Care, Other (non HMO) | Admitting: Family Medicine

## 2017-08-18 VITALS — BP 126/82 | HR 74 | Temp 98.1°F | Resp 18 | Ht 75.0 in | Wt 252.0 lb

## 2017-08-18 DIAGNOSIS — R739 Hyperglycemia, unspecified: Secondary | ICD-10-CM

## 2017-08-18 DIAGNOSIS — R7989 Other specified abnormal findings of blood chemistry: Secondary | ICD-10-CM | POA: Diagnosis not present

## 2017-08-18 DIAGNOSIS — E669 Obesity, unspecified: Secondary | ICD-10-CM

## 2017-08-18 DIAGNOSIS — E782 Mixed hyperlipidemia: Secondary | ICD-10-CM | POA: Diagnosis not present

## 2017-08-18 DIAGNOSIS — I10 Essential (primary) hypertension: Secondary | ICD-10-CM | POA: Diagnosis not present

## 2017-08-18 MED ORDER — TESTOSTERONE 30 MG/ACT TD SOLN: 60.0000 mg | Freq: Every day | TRANSDERMAL | 3 refills | Status: DC

## 2017-08-18 MED ORDER — LISINOPRIL 20 MG PO TABS: 20.0000 mg | ORAL_TABLET | Freq: Two times a day (BID) | ORAL | 1 refills | Status: DC

## 2017-08-18 NOTE — Assessment & Plan Note (Signed)
Noting significant fatigue. Will try Axiron 60 mg daily and recheck in 3 months

## 2017-08-18 NOTE — Telephone Encounter (Signed)
PA initiated via Covermymeds; KEY: [id number]. Awaiting determination.

## 2017-08-18 NOTE — Assessment & Plan Note (Signed)
hgba1c acceptable, minimize simple carbs. Increase exercise as tolerated.  

## 2017-08-18 NOTE — Patient Instructions (Signed)
MIND plan  DASH Eating Plan DASH stands for "Dietary Approaches to Stop Hypertension." The DASH eating plan is a healthy eating plan that has been shown to reduce high blood pressure (hypertension). It may also reduce your risk for type 2 diabetes, heart disease, and stroke. The DASH eating plan may also help with weight loss. What are tips for following this plan? General guidelines  Avoid eating more than 2,300 mg (milligrams) of salt (sodium) a day. If you have hypertension, you may need to reduce your sodium intake to 1,500 mg a day.  Limit alcohol intake to no more than 1 drink a day for nonpregnant women and 2 drinks a day for men. One drink equals 12 oz of beer, 5 oz of wine, or 1 oz of hard liquor.  Work with your health care provider to maintain a healthy body weight or to lose weight. Ask what an ideal weight is for you.  Get at least 30 minutes of exercise that causes your heart to beat faster (aerobic exercise) most days of the week. Activities may include walking, swimming, or biking.  Work with your health care provider or diet and nutrition specialist (dietitian) to adjust your eating plan to your individual calorie needs. Reading food labels  Check food labels for the amount of sodium per serving. Choose foods with less than 5 percent of the Daily Value of sodium. Generally, foods with less than 300 mg of sodium per serving fit into this eating plan.  To find whole grains, look for the word "whole" as the first word in the ingredient list. Shopping  Buy products labeled as "low-sodium" or "no salt added."  Buy fresh foods. Avoid canned foods and premade or frozen meals. Cooking  Avoid adding salt when cooking. Use salt-free seasonings or herbs instead of table salt or sea salt. Check with your health care provider or pharmacist before using salt substitutes.  Do not fry foods. Cook foods using healthy methods such as baking, boiling, grilling, and broiling  instead.  Cook with heart-healthy oils, such as olive, canola, soybean, or sunflower oil. Meal planning   Eat a balanced diet that includes: ? 5 or more servings of fruits and vegetables each day. At each meal, try to fill half of your plate with fruits and vegetables. ? Up to 6-8 servings of whole grains each day. ? Less than 6 oz of lean meat, poultry, or fish each day. A 3-oz serving of meat is about the same size as a deck of cards. One egg equals 1 oz. ? 2 servings of low-fat dairy each day. ? A serving of nuts, seeds, or beans 5 times each week. ? Heart-healthy fats. Healthy fats called Omega-3 fatty acids are found in foods such as flaxseeds and coldwater fish, like sardines, salmon, and mackerel.  Limit how much you eat of the following: ? Canned or prepackaged foods. ? Food that is high in trans fat, such as fried foods. ? Food that is high in saturated fat, such as fatty meat. ? Sweets, desserts, sugary drinks, and other foods with added sugar. ? Full-fat dairy products.  Do not salt foods before eating.  Try to eat at least 2 vegetarian meals each week.  Eat more home-cooked food and less restaurant, buffet, and fast food.  When eating at a restaurant, ask that your food be prepared with less salt or no salt, if possible. What foods are recommended? The items listed may not be a complete list. Talk with your  dietitian about what dietary choices are best for you. Grains Whole-grain or whole-wheat bread. Whole-grain or whole-wheat pasta. Brown rice. Oatmeal. Quinoa. Bulgur. Whole-grain and low-sodium cereals. Pita bread. Low-fat, low-sodium crackers. Whole-wheat flour tortillas. Vegetables Fresh or frozen vegetables (raw, steamed, roasted, or grilled). Low-sodium or reduced-sodium tomato and vegetable juice. Low-sodium or reduced-sodium tomato sauce and tomato paste. Low-sodium or reduced-sodium canned vegetables. Fruits All fresh, dried, or frozen fruit. Canned fruit in  natural juice (without added sugar). Meat and other protein foods Skinless chicken or turkey. Ground chicken or turkey. Pork with fat trimmed off. Fish and seafood. Egg whites. Dried beans, peas, or lentils. Unsalted nuts, nut butters, and seeds. Unsalted canned beans. Lean cuts of beef with fat trimmed off. Low-sodium, lean deli meat. Dairy Low-fat (1%) or fat-free (skim) milk. Fat-free, low-fat, or reduced-fat cheeses. Nonfat, low-sodium ricotta or cottage cheese. Low-fat or nonfat yogurt. Low-fat, low-sodium cheese. Fats and oils Soft margarine without trans fats. Vegetable oil. Low-fat, reduced-fat, or light mayonnaise and salad dressings (reduced-sodium). Canola, safflower, olive, soybean, and sunflower oils. Avocado. Seasoning and other foods Herbs. Spices. Seasoning mixes without salt. Unsalted popcorn and pretzels. Fat-free sweets. What foods are not recommended? The items listed may not be a complete list. Talk with your dietitian about what dietary choices are best for you. Grains Baked goods made with fat, such as croissants, muffins, or some breads. Dry pasta or rice meal packs. Vegetables Creamed or fried vegetables. Vegetables in a cheese sauce. Regular canned vegetables (not low-sodium or reduced-sodium). Regular canned tomato sauce and paste (not low-sodium or reduced-sodium). Regular tomato and vegetable juice (not low-sodium or reduced-sodium). Pickles. Olives. Fruits Canned fruit in a light or heavy syrup. Fried fruit. Fruit in cream or butter sauce. Meat and other protein foods Fatty cuts of meat. Ribs. Fried meat. Bacon. Sausage. Bologna and other processed lunch meats. Salami. Fatback. Hotdogs. Bratwurst. Salted nuts and seeds. Canned beans with added salt. Canned or smoked fish. Whole eggs or egg yolks. Chicken or turkey with skin. Dairy Whole or 2% milk, cream, and half-and-half. Whole or full-fat cream cheese. Whole-fat or sweetened yogurt. Full-fat cheese. Nondairy  creamers. Whipped toppings. Processed cheese and cheese spreads. Fats and oils Butter. Stick margarine. Lard. Shortening. Ghee. Bacon fat. Tropical oils, such as coconut, palm kernel, or palm oil. Seasoning and other foods Salted popcorn and pretzels. Onion salt, garlic salt, seasoned salt, table salt, and sea salt. Worcestershire sauce. Tartar sauce. Barbecue sauce. Teriyaki sauce. Soy sauce, including reduced-sodium. Steak sauce. Canned and packaged gravies. Fish sauce. Oyster sauce. Cocktail sauce. Horseradish that you find on the shelf. Ketchup. Mustard. Meat flavorings and tenderizers. Bouillon cubes. Hot sauce and Tabasco sauce. Premade or packaged marinades. Premade or packaged taco seasonings. Relishes. Regular salad dressings. Where to find more information:  National Heart, Lung, and Blood Institute: www.nhlbi.nih.gov  American Heart Association: www.heart.org Summary  The DASH eating plan is a healthy eating plan that has been shown to reduce high blood pressure (hypertension). It may also reduce your risk for type 2 diabetes, heart disease, and stroke.  With the DASH eating plan, you should limit salt (sodium) intake to 2,300 mg a day. If you have hypertension, you may need to reduce your sodium intake to 1,500 mg a day.  When on the DASH eating plan, aim to eat more fresh fruits and vegetables, whole grains, lean proteins, low-fat dairy, and heart-healthy fats.  Work with your health care provider or diet and nutrition specialist (dietitian) to adjust your eating plan   to your individual calorie needs. This information is not intended to replace advice given to you by your health care provider. Make sure you discuss any questions you have with your health care provider. Document Released: 03/06/2011 Document Revised: 03/10/2016 Document Reviewed: 03/10/2016 Elsevier Interactive Patient Education  2018 Elsevier Inc.  

## 2017-08-18 NOTE — Assessment & Plan Note (Signed)
Encouraged heart healthy diet, increase exercise, avoid trans fats, consider a krill oil cap daily. He agrees to try to avoid fast food and we will recheck in 3 months
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
## 2017-08-18 NOTE — Assessment & Plan Note (Signed)
Well controlled, no changes to meds. Encouraged heart healthy diet such as the DASH diet and exercise as tolerated.  °

## 2017-08-20 NOTE — Telephone Encounter (Signed)
PA approved. Effective 08/19/2017 through 08/20/2018.

## 2017-11-24 ENCOUNTER — Ambulatory Visit: Payer: Managed Care, Other (non HMO) | Admitting: Family Medicine

## 2017-12-01 ENCOUNTER — Ambulatory Visit: Payer: Managed Care, Other (non HMO) | Admitting: Family Medicine

## 2018-01-12 ENCOUNTER — Encounter: Payer: Self-pay | Admitting: Family Medicine

## 2018-02-01 ENCOUNTER — Encounter: Payer: Self-pay | Admitting: Family Medicine

## 2018-02-01 ENCOUNTER — Ambulatory Visit: Payer: Managed Care, Other (non HMO) | Admitting: Family Medicine

## 2018-02-01 VITALS — BP 130/88 | HR 75 | Temp 98.5°F | Resp 16 | Ht 75.0 in | Wt 257.0 lb

## 2018-02-01 DIAGNOSIS — Z79899 Other long term (current) drug therapy: Secondary | ICD-10-CM | POA: Diagnosis not present

## 2018-02-01 DIAGNOSIS — R739 Hyperglycemia, unspecified: Secondary | ICD-10-CM | POA: Diagnosis not present

## 2018-02-01 DIAGNOSIS — E782 Mixed hyperlipidemia: Secondary | ICD-10-CM

## 2018-02-01 DIAGNOSIS — R7989 Other specified abnormal findings of blood chemistry: Secondary | ICD-10-CM | POA: Diagnosis not present

## 2018-02-01 DIAGNOSIS — J069 Acute upper respiratory infection, unspecified: Secondary | ICD-10-CM

## 2018-02-01 DIAGNOSIS — E6609 Other obesity due to excess calories: Secondary | ICD-10-CM

## 2018-02-01 DIAGNOSIS — I10 Essential (primary) hypertension: Secondary | ICD-10-CM

## 2018-02-01 MED ORDER — LISINOPRIL 20 MG PO TABS: 20.0000 mg | ORAL_TABLET | Freq: Two times a day (BID) | ORAL | 5 refills | Status: DC

## 2018-02-01 MED ORDER — AMOXICILLIN 500 MG PO CAPS: 500.0000 mg | ORAL_CAPSULE | Freq: Three times a day (TID) | ORAL | 0 refills | Status: DC

## 2018-02-01 MED ORDER — TESTOSTERONE 30 MG/ACT TD SOLN: 60.0000 mg | Freq: Every day | TRANSDERMAL | 3 refills | Status: DC

## 2018-02-01 NOTE — Assessment & Plan Note (Signed)
Encouraged increased rest and hydration, add probiotics, zinc such as Coldeze or Xicam. Treat fevers as needed. Mucinex bid vitamin C and Elderberry. Amox if gets worse

## 2018-02-01 NOTE — Assessment & Plan Note (Signed)
hgba1c acceptable, minimize simple carbs. Increase exercise as tolerated.  

## 2018-02-01 NOTE — Assessment & Plan Note (Signed)
Well controlled, no changes to meds. Encouraged heart healthy diet such as the DASH diet and exercise as tolerated.  °

## 2018-02-01 NOTE — Patient Instructions (Signed)
Vitamin C, Elderberry, Zinc. Mucinex when symptoms start Testosterone skin gel What is this medicine? TESTOSTERONE (tes TOS ter one) is the main male hormone. It supports normal male traits such as muscle growth, facial hair, and deep voice. This gel is used in males to treat low testosterone levels. This medicine may be used for other purposes; ask your health care provider or pharmacist if you have questions. COMMON BRAND NAME(S): AndroGel, FORTESTA, Testim, Vogelxo What should I tell my health care provider before I take this medicine? They need to know if you have any of these conditions: -breast cancer -diabetes -heart disease -if a male partner is pregnant or trying to get pregnant -kidney disease -liver disease -lung disease -prostate cancer, enlargement -an unusual or allergic reaction to testosterone, soy proteins, other medicines, foods, dyes, or preservatives -pregnant or trying to get pregnant -breast-feeding How should I use this medicine? This medicine is for external use only. This medicine is applied at the same time every day (preferably in the morning) to clean, dry, intact skin. If you take a bath or shower in the morning, apply the gel after the bath or shower. Follow the directions on the prescription label. Make sure that you are using your testosterone gel product correctly and applying it only to the appropriate skin area (see below). Allow the skin to dry a few minutes then cover with clothing to prevent others from coming in contact with the medicine on your skin. The gel is flammable. Avoid fire, flame, or smoking until the gel has dried. Wash your hands with soap and water after use. For AndroGel 1% Packets: Open the packet(s) needed for your dose. You can put the entire dose into your palm all at once or just a little at a time to apply. If you prefer, you can instead squeeze the gel directly onto the area you are applying it to. Apply on the shoulders, upper arm,  or abdomen as directed. Do not apply to the scrotum or genitals. Be sure you use the correct total dose. It is best to wait 5 to 6 hours after application of the gel before showering or swimming. For AndroGel 1%: Pump the dose into the palm of your hand. You can put the entire dose into your palm all at once or just a little at a time to apply. If you prefer, you can instead pump the gel directly onto the area you are applying it to. Apply on the shoulders, upper arm, or abdomen as directed. Do not apply to the scrotum or genitals. Be sure you use the correct total dose. It is best to wait for 5 to 6 hours after application of the gel before showering or swimming. For Androgel 1.62% packets: Open the packet(s) needed for your dose. You can put the entire dose into your palm all at once or just a little at a time to apply. If you prefer, you can instead squeeze the gel directly onto the area you are applying it to. Apply on the shoulders and upper arms as directed. Do not apply to other parts of the body including the abdomen, genitals, chest, armpits, or knees. Be sure you use the correct total dose. It is best to wait 2 hours after application of the gel before washing, showering, or swimming. For AndroGel 1.62%: Pump the dose into the palm of your hand. Dispense one pump of gel at a time into the palm of your hand before applying it. If you prefer, you can  instead pump the gel directly onto the area you are applying it to. Apply on the shoulders and upper arms as directed. Do not apply to other parts of the body including the abdomen, genitals, chest, armpits, or knees. Be sure you use the correct total dose. It is best to wait 2 hours after application of the gel before washing, showering, or swimming. For Testim: Open the tube(s) needed for your dose. Squeeze the gel from the tube into the palm of your hand. Apply on the shoulders or upper arms as directed. Do not apply to the scrotum, genitals, or abdomen.  Be sure you use the correct total dose. Do not shower or swim for at least 2 hours after application of the gel. For Fortesta: Use the multi-dose pump to pump the gel directly onto the area you are applying it to. Apply on the thighs as directed. Do not apply to the abdomen, penis, scrotum, shoulders or upper arms. Gently rub the gel onto the skin using your finger. Be sure you use the correct total dose. Do not shower or swim for at least 2 hours after application of the gel. A special MedGuide will be given to you by the pharmacist with each prescription and refill. Be sure to read this information carefully each time. Talk to your pediatrician regarding the use of this medicine in children. Special care may be needed. Overdosage: If you think you have taken too much of this medicine contact a poison control center or emergency room at once. NOTE: This medicine is only for you. Do not share this medicine with others. What if I miss a dose? If you miss a dose, use it as soon as you can. If it is almost time for your next dose, use only that dose. Do not use double or extra doses. What may interact with this medicine? -medicines for diabetes -medicines that treat or prevent blood clots like warfarin -oxyphenbutazone -propranolol -steroid medicines like prednisone or cortisone This list may not describe all possible interactions. Give your health care provider a list of all the medicines, herbs, non-prescription drugs, or dietary supplements you use. Also tell them if you smoke, drink alcohol, or use illegal drugs. Some items may interact with your medicine. What should I watch for while using this medicine? Visit your doctor or health care professional for regular checks on your progress. They will need to check the level of testosterone in your blood. This medicine is only approved for use in men who have low levels of testosterone related to certain medical conditions. Heart attacks and strokes  have been reported with the use of this medicine. Notify your doctor or health care professional and seek emergency treatment if you develop breathing problems; changes in vision; confusion; chest pain or chest tightness; sudden arm pain; severe, sudden headache; trouble speaking or understanding; sudden numbness or weakness of the face, arm or leg; loss of balance or coordination. Talk to your doctor about the risks and benefits of this medicine. This medicine can transfer from your body to others. If a person or pet comes in contact with the area where this medicine was applied to your skin, they may have a serious risk of side effects. If you cannot avoid skin-to-skin contact with another person, make sure the site where this medicine was applied is covered with clothing. If accidental contact happens, the skin of the person or pet should be washed right away with soap and water. Also, a male partner who is  pregnant or trying to get pregnant should avoid contact with the gel or treated skin. This medicine may affect blood sugar levels. If you have diabetes, check with your doctor or health care professional before you change your diet or the dose of your diabetic medicine. This drug is banned from use in athletes by most athletic organizations. What side effects may I notice from receiving this medicine? Side effects that you should report to your doctor or health care professional as soon as possible: -allergic reactions like skin rash, itching or hives, swelling of the face, lips, or tongue -breast enlargement -breathing problems -changes in mood, especially anger, depression, or rage -dark urine -general ill feeling or flu-like symptoms -light-colored stools -loss of appetite, nausea -nausea, vomiting -right upper belly pain -stomach pain -swelling of ankles -too frequent or persistent erections -trouble passing urine or change in the amount of urine -unusually weak or tired -yellowing  of the eyes or skin Side effects that usually do not require medical attention (report to your doctor or health care professional if they continue or are bothersome): -acne -change in sex drive or performance -hair loss -headache This list may not describe all possible side effects. Call your doctor for medical advice about side effects. You may report side effects to FDA at 1-800-FDA-1088. Where should I keep my medicine? Keep out of the reach of children. This medicine can be abused. Keep your medicine in a safe place to protect it from theft. Do not share this medicine with anyone. Selling or giving away this medicine is dangerous and against the law. Store at room temperature between 15 to 30 degrees C (59 to 86 degrees F). Keep closed until use. Protect from heat and light. This medicine is flammable. Avoid exposure to heat, fire, flame, and smoking. Throw away any unused medicine after the expiration date. NOTE: This sheet is a summary. It may not cover all possible information. If you have questions about this medicine, talk to your doctor, pharmacist, or health care provider.  2018 Elsevier/Gold Standard (2013-06-02 08:27:26)

## 2018-02-01 NOTE — Assessment & Plan Note (Signed)
Check level today 

## 2018-02-01 NOTE — Assessment & Plan Note (Signed)
Encouraged heart healthy diet, increase exercise, avoid trans fats, consider a krill oil cap daily 

## 2018-02-02 LAB — PAIN MGMT, PROFILE 8 W/CONF, U
6 Acetylmorphine: NEGATIVE ng/mL (ref <10)
AMPHETAMINES: NEGATIVE ng/mL (ref <500)
Alcohol Metabolites: NEGATIVE ng/mL (ref <500)
BENZODIAZEPINES: NEGATIVE ng/mL (ref <100)
BUPRENORPHINE, URINE: NEGATIVE ng/mL (ref <5)
Cocaine Metabolite: NEGATIVE ng/mL (ref <150)
Creatinine: 141.9 mg/dL
MDMA: NEGATIVE ng/mL (ref <500)
Marijuana Metabolite: NEGATIVE ng/mL (ref <20)
Opiates: NEGATIVE ng/mL (ref <100)
Oxidant: NEGATIVE ug/mL (ref <200)
Oxycodone: NEGATIVE ng/mL (ref <100)
pH: 6.8 (ref 4.5–9.0)

## 2018-02-02 LAB — COMPREHENSIVE METABOLIC PANEL
ALT: 35 U/L (ref 0–53)
AST: 19 U/L (ref 0–37)
Albumin: 4.8 g/dL (ref 3.5–5.2)
Alkaline Phosphatase: 56 U/L (ref 39–117)
BUN: 20 mg/dL (ref 6–23)
CHLORIDE: 101 meq/L (ref 96–112)
CO2: 29 mEq/L (ref 19–32)
Calcium: 9.8 mg/dL (ref 8.4–10.5)
Creatinine, Ser: 1.17 mg/dL (ref 0.40–1.50)
GFR: 73.19 mL/min (ref >60.00)
GLUCOSE: 100 mg/dL — AB (ref 70–99)
Potassium: 4.4 mEq/L (ref 3.5–5.1)
Sodium: 139 mEq/L (ref 135–145)
Total Bilirubin: 0.4 mg/dL (ref 0.2–1.2)
Total Protein: 7.3 g/dL (ref 6.0–8.3)

## 2018-02-02 LAB — LIPID PANEL
CHOL/HDL RATIO: 5
Cholesterol: 255 mg/dL — ABNORMAL HIGH (ref 0–200)
HDL: 46.5 mg/dL (ref >39.00)
Triglycerides: 472 mg/dL — ABNORMAL HIGH (ref 0.0–149.0)

## 2018-02-02 LAB — CBC
HCT: 43.5 % (ref 39.0–52.0)
HEMOGLOBIN: 14.7 g/dL (ref 13.0–17.0)
MCHC: 33.8 g/dL (ref 30.0–36.0)
MCV: 89.3 fl (ref 78.0–100.0)
PLATELETS: 275 10*3/uL (ref 150.0–400.0)
RBC: 4.87 Mil/uL (ref 4.22–5.81)
RDW: 12.8 % (ref 11.5–15.5)
WBC: 9 10*3/uL (ref 4.0–10.5)

## 2018-02-02 LAB — HEMOGLOBIN A1C: HEMOGLOBIN A1C: 6.5 % (ref 4.6–6.5)

## 2018-02-02 LAB — TSH: TSH: 2.34 u[IU]/mL (ref 0.35–4.50)

## 2018-02-02 LAB — TESTOSTERONE: TESTOSTERONE: 289.54 ng/dL — AB (ref 300.00–890.00)

## 2018-02-02 LAB — LDL CHOLESTEROL, DIRECT: Direct LDL: 171 mg/dL
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
## 2018-02-02 NOTE — Assessment & Plan Note (Signed)
Encouraged DASH diet, decrease po intake and increase exercise as tolerated. Needs 7-8 hours of sleep nightly. Avoid trans fats, eat small, frequent meals every 4-5 hours with lean proteins, complex carbs and healthy fats. Minimize simple carbs 

## 2018-04-24 ENCOUNTER — Encounter: Payer: Self-pay | Admitting: Family Medicine

## 2018-04-26 ENCOUNTER — Other Ambulatory Visit: Payer: Self-pay | Admitting: Family Medicine

## 2018-04-26 MED ORDER — TESTOSTERONE 30 MG/ACT TD SOLN: 60.0000 mg | Freq: Every day | TRANSDERMAL | 3 refills | Status: DC

## 2018-08-03 ENCOUNTER — Ambulatory Visit (INDEPENDENT_AMBULATORY_CARE_PROVIDER_SITE_OTHER): Payer: Managed Care, Other (non HMO) | Admitting: Family Medicine

## 2018-08-03 ENCOUNTER — Other Ambulatory Visit: Payer: Self-pay

## 2018-08-03 DIAGNOSIS — R739 Hyperglycemia, unspecified: Secondary | ICD-10-CM

## 2018-08-03 DIAGNOSIS — R7989 Other specified abnormal findings of blood chemistry: Secondary | ICD-10-CM | POA: Diagnosis not present

## 2018-08-03 DIAGNOSIS — E782 Mixed hyperlipidemia: Secondary | ICD-10-CM

## 2018-08-03 DIAGNOSIS — I10 Essential (primary) hypertension: Secondary | ICD-10-CM

## 2018-08-03 MED ORDER — TESTOSTERONE 30 MG/ACT TD SOLN: 60.0000 mg | Freq: Every day | TRANSDERMAL | 5 refills | Status: DC

## 2018-08-03 MED ORDER — LISINOPRIL 20 MG PO TABS: 20.0000 mg | ORAL_TABLET | Freq: Two times a day (BID) | ORAL | 5 refills | Status: DC

## 2018-08-03 NOTE — Assessment & Plan Note (Signed)
Given refill on testosterone. Will check labs with next visit

## 2018-08-03 NOTE — Assessment & Plan Note (Signed)
Encouraged heart healthy diet, increase exercise, avoid trans fats, consider a krill oil cap daily 
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
## 2018-08-03 NOTE — Assessment & Plan Note (Signed)
Refill given on Lisinopril

## 2018-08-03 NOTE — Assessment & Plan Note (Signed)
hgba1c acceptable, minimize simple carbs. Increase exercise as tolerated.  

## 2019-02-02 ENCOUNTER — Encounter: Payer: Self-pay | Admitting: Family Medicine

## 2019-02-03 NOTE — Telephone Encounter (Signed)
Drue Dun, Please see if patient wants to come in to see [place] or do a virtual with PCP Please advise

## 2019-02-04 ENCOUNTER — Other Ambulatory Visit: Payer: Self-pay

## 2019-02-04 ENCOUNTER — Encounter: Payer: Self-pay | Admitting: Family Medicine

## 2019-02-04 ENCOUNTER — Ambulatory Visit: Payer: Managed Care, Other (non HMO) | Admitting: Family Medicine

## 2019-02-04 VITALS — BP 132/86 | HR 85 | Temp 98.0°F | Ht 75.0 in | Wt 248.0 lb

## 2019-02-04 DIAGNOSIS — I10 Essential (primary) hypertension: Secondary | ICD-10-CM | POA: Diagnosis not present

## 2019-02-04 DIAGNOSIS — M545 Low back pain, unspecified: Secondary | ICD-10-CM

## 2019-02-04 MED ORDER — LISINOPRIL 20 MG PO TABS: 20.0000 mg | ORAL_TABLET | Freq: Two times a day (BID) | ORAL | 5 refills | Status: DC

## 2019-02-04 NOTE — Progress Notes (Signed)
Musculoskeletal Exam  Patient: Michael Gallagher DOB: 1975/05/27  DOS: 02/04/2019  SUBJECTIVE:  Chief Complaint:   Chief Complaint  Patient presents with  . Flank Pain    left side    Michael Gallagher is a 43 y.o.  male for evaluation and treatment of L back pain.   Onset:  3 months ago. No inj or change in activity.  Location: Lower left Character:  sharp and stabbing  Progression of issue:  is unchanged Associated symptoms: No bruising, redness, swelling Treatment: to date has been heat, OTC NSAIDS.   Neurovascular symptoms: no  Hypertension Patient presents for hypertension follow up. He does monitor home blood pressures. Blood pressures ranging on average from 130's/80's. He is compliant with medication- lisinopril 20 mg bid. Patient has these side effects of medication: none He is adhering to a healthy diet overall. Exercise: active at work, wt resistance, cardio at gym No hx of kidney stones, no urinary complaints.   ROS: Musculoskeletal/Extremities: +left sided back pain Neuro: No weakness  Past Medical History:  Diagnosis Date  . Arthritis    pt. reports- cerv. HNP  . Complication of anesthesia   . Environmental and seasonal allergies 08/19/2015  . Erectile dysfunction 11/08/2015  . Fatigue 11/08/2015  . Hyperlipidemia, mixed 08/19/2015  . Hypertension    Dr. Wynonia Lawman- did stress test, wnl, told that he was having chest wall pain    . Insomnia 11/18/2015  . Kidney stones   . Obesity 08/19/2015  . PONV (postoperative nausea and vomiting)   . Seasonal allergies 08/13/2015  . Shoulder pain, bilateral 07/29/2016    Objective: VITAL SIGNS: BP 132/86 (BP Location: Left Arm, Patient Position: Sitting, Cuff Size: Large)   Pulse 85   Temp 98 F (36.7 C) (Temporal)   Ht 6\' 3"  (1.905 m)   Wt 248 lb (112.5 kg)   SpO2 98%   BMI 31.00 kg/m  Constitutional: Well formed, well developed. No acute distress. Cardiovascular: RRR, no bruits, no LE edema Thorax & Lungs:  CTAB. No accessory muscle use Musculoskeletal: back.   Tenderness to palpation: mild ttp over L sided ES group near T8-10 Deformity: no Ecchymosis: no Tests positive: none Tests negative: Straight leg Poor hamstring ROM b/l Neurologic: Normal sensory function. No focal deficits noted. DTR's equal and symmetric in LE's. No clonus. Psychiatric: Normal mood. Age appropriate judgment and insight. Alert & oriented x 3.    Assessment:  Left-sided low back pain without sciatica, unspecified chronicity  Essential hypertension - Plan: lisinopril (ZESTRIL) 20 MG tablet  Plan: 1- Stretches/exercises, heat, ice, Tylenol, ibuprofen. Needs to rehab, unlikely to be stone or infection given duration, if no improvement in 3-4 weeks will set up w PT. 2- Controlled. Will refill ACEi.  F/u w reg PCP for CPE and labs. The patient voiced understanding and agreement to the plan.   [place], DO 02/04/19  6:52 PM

## 2019-02-04 NOTE — Patient Instructions (Signed)
Heat (pad or rice pillow in microwave) over affected area, 10-15 minutes twice daily.   Ice/cold pack over area for 10-15 min twice daily.  Keep the diet clean and stay active.  EXERCISES  RANGE OF MOTION (ROM) AND STRETCHING EXERCISES - Low Back Pain Most people with lower back pain will find that their symptoms get worse with excessive bending forward (flexion) or arching at the lower back (extension). The exercises that will help resolve your symptoms will focus on the opposite motion.  If you have pain, numbness or tingling which travels down into your buttocks, leg or foot, the goal of the therapy is for these symptoms to move closer to your back and eventually resolve. Sometimes, these leg symptoms will get better, but your lower back pain may worsen. This is often an indication of progress in your rehabilitation. Be very alert to any changes in your symptoms and the activities in which you participated in the 24 hours prior to the change. Sharing this information with your caregiver will allow him or her to most efficiently treat your condition. These exercises may help you when beginning to rehabilitate your injury. Your symptoms may resolve with or without further involvement from your physician, physical therapist or athletic trainer. While completing these exercises, remember:   Restoring tissue flexibility helps normal motion to return to the joints. This allows healthier, less painful movement and activity.  An effective stretch should be held for at least 30 seconds.  A stretch should never be painful. You should only feel a gentle lengthening or release in the stretched tissue. FLEXION RANGE OF MOTION AND STRETCHING EXERCISES:  STRETCH - Flexion, Single Knee to Chest   Lie on a firm bed or floor with both legs extended in front of you.  Keeping one leg in contact with the floor, bring your opposite knee to your chest. Hold your leg in place by either grabbing behind your thigh  or at your knee.  Pull until you feel a gentle stretch in your low back. Hold 30 seconds.  Slowly release your grasp and repeat the exercise with the opposite side. Repeat 2 times. Complete this exercise 3 times per week.   STRETCH - Flexion, Double Knee to Chest  Lie on a firm bed or floor with both legs extended in front of you.  Keeping one leg in contact with the floor, bring your opposite knee to your chest.  Tense your stomach muscles to support your back and then lift your other knee to your chest. Hold your legs in place by either grabbing behind your thighs or at your knees.  Pull both knees toward your chest until you feel a gentle stretch in your low back. Hold 30 seconds.  Tense your stomach muscles and slowly return one leg at a time to the floor. Repeat 2 times. Complete this exercise 3 times per week.   STRETCH - Low Trunk Rotation  Lie on a firm bed or floor. Keeping your legs in front of you, bend your knees so they are both pointed toward the ceiling and your feet are flat on the floor.  Extend your arms out to the side. This will stabilize your upper body by keeping your shoulders in contact with the floor.  Gently and slowly drop both knees together to one side until you feel a gentle stretch in your low back. Hold for 30 seconds.  Tense your stomach muscles to support your lower back as you bring your knees back to  the starting position. Repeat the exercise to the other side. Repeat 2 times. Complete this exercise at least 3 times per week.   EXTENSION RANGE OF MOTION AND FLEXIBILITY EXERCISES:  STRETCH - Extension, Prone on Elbows   Lie on your stomach on the floor, a bed will be too soft. Place your palms about shoulder width apart and at the height of your head.  Place your elbows under your shoulders. If this is too painful, stack pillows under your chest.  Allow your body to relax so that your hips drop lower and make contact more completely with the  floor.  Hold this position for 30 seconds.  Slowly return to lying flat on the floor. Repeat 2 times. Complete this exercise 3 times per week.   RANGE OF MOTION - Extension, Prone Press Ups  Lie on your stomach on the floor, a bed will be too soft. Place your palms about shoulder width apart and at the height of your head.  Keeping your back as relaxed as possible, slowly straighten your elbows while keeping your hips on the floor. You may adjust the placement of your hands to maximize your comfort. As you gain motion, your hands will come more underneath your shoulders.  Hold this position 30 seconds.  Slowly return to lying flat on the floor. Repeat 2 times. Complete this exercise 3 times per week.   RANGE OF MOTION- Quadruped, Neutral Spine   Assume a hands and knees position on a firm surface. Keep your hands under your shoulders and your knees under your hips. You may place padding under your knees for comfort.  Drop your head and point your tailbone toward the ground below you. This will round out your lower back like an angry cat. Hold this position for 30 seconds.  Slowly lift your head and release your tail bone so that your back sags into a large arch, like an old horse.  Hold this position for 30 seconds.  Repeat this until you feel limber in your low back.  Now, find your "sweet spot." This will be the most comfortable position somewhere between the two previous positions. This is your neutral spine. Once you have found this position, tense your stomach muscles to support your low back.  Hold this position for 30 seconds. Repeat 2 times. Complete this exercise 3 times per week.   STRENGTHENING EXERCISES - Low Back Sprain These exercises may help you when beginning to rehabilitate your injury. These exercises should be done near your "sweet spot." This is the neutral, low-back arch, somewhere between fully rounded and fully arched, that is your least painful position.  When performed in this safe range of motion, these exercises can be used for people who have either a flexion or extension based injury. These exercises may resolve your symptoms with or without further involvement from your physician, physical therapist or athletic trainer. While completing these exercises, remember:   Muscles can gain both the endurance and the strength needed for everyday activities through controlled exercises.  Complete these exercises as instructed by your physician, physical therapist or athletic trainer. Increase the resistance and repetitions only as guided.  You may experience muscle soreness or fatigue, but the pain or discomfort you are trying to eliminate should never worsen during these exercises. If this pain does worsen, stop and make certain you are following the directions exactly. If the pain is still present after adjustments, discontinue the exercise until you can discuss the trouble with your caregiver.  STRENGTHENING - Deep Abdominals, Pelvic Tilt   Lie on a firm bed or floor. Keeping your legs in front of you, bend your knees so they are both pointed toward the ceiling and your feet are flat on the floor.  Tense your lower abdominal muscles to press your low back into the floor. This motion will rotate your pelvis so that your tail bone is scooping upwards rather than pointing at your feet or into the floor. With a gentle tension and even breathing, hold this position for 3 seconds. Repeat 2 times. Complete this exercise 3 times per week.   STRENGTHENING - Abdominals, Crunches   Lie on a firm bed or floor. Keeping your legs in front of you, bend your knees so they are both pointed toward the ceiling and your feet are flat on the floor. Cross your arms over your chest.  Slightly tip your chin down without bending your neck.  Tense your abdominals and slowly lift your trunk high enough to just clear your shoulder blades. Lifting higher can put excessive  stress on the lower back and does not further strengthen your abdominal muscles.  Control your return to the starting position. Repeat 2 times. Complete this exercise 3 times per week.   STRENGTHENING - Quadruped, Opposite UE/LE Lift   Assume a hands and knees position on a firm surface. Keep your hands under your shoulders and your knees under your hips. You may place padding under your knees for comfort.  Find your neutral spine and gently tense your abdominal muscles so that you can maintain this position. Your shoulders and hips should form a rectangle that is parallel with the floor and is not twisted.  Keeping your trunk steady, lift your right hand no higher than your shoulder and then your left leg no higher than your hip. Make sure you are not holding your breath. Hold this position for 30 seconds.  Continuing to keep your abdominal muscles tense and your back steady, slowly return to your starting position. Repeat with the opposite arm and leg. Repeat 2 times. Complete this exercise 3 times per week.   STRENGTHENING - Abdominals and Quadriceps, Straight Leg Raise   Lie on a firm bed or floor with both legs extended in front of you.  Keeping one leg in contact with the floor, bend the other knee so that your foot can rest flat on the floor.  Find your neutral spine, and tense your abdominal muscles to maintain your spinal position throughout the exercise.  Slowly lift your straight leg off the floor about 6 inches for a count of 3, making sure to not hold your breath.  Still keeping your neutral spine, slowly lower your leg all the way to the floor. Repeat this exercise with each leg 2 times. Complete this exercise 3 times per week.  POSTURE AND BODY MECHANICS CONSIDERATIONS - Low Back Sprain Keeping correct posture when sitting, standing or completing your activities will reduce the stress put on different body tissues, allowing injured tissues a chance to heal and limiting  painful experiences. The following are general guidelines for improved posture.  While reading these guidelines, remember:  The exercises prescribed by your provider will help you have the flexibility and strength to maintain correct postures.  The correct posture provides the best environment for your joints to work. All of your joints have less wear and tear when properly supported by a spine with good posture. This means you will experience a healthier, less painful  body.  Correct posture must be practiced with all of your activities, especially prolonged sitting and standing. Correct posture is as important when doing repetitive low-stress activities (typing) as it is when doing a single heavy-load activity (lifting).  RESTING POSITIONS Consider which positions are most painful for you when choosing a resting position. If you have pain with flexion-based activities (sitting, bending, stooping, squatting), choose a position that allows you to rest in a less flexed posture. You would want to avoid curling into a fetal position on your side. If your pain worsens with extension-based activities (prolonged standing, working overhead), avoid resting in an extended position such as sleeping on your stomach. Most people will find more comfort when they rest with their spine in a more neutral position, neither too rounded nor too arched. Lying on a non-sagging bed on your side with a pillow between your knees, or on your back with a pillow under your knees will often provide some relief. Keep in mind, being in any one position for a prolonged period of time, no matter how correct your posture, can still lead to stiffness.  PROPER SITTING POSTURE In order to minimize stress and discomfort on your spine, you must sit with correct posture. Sitting with good posture should be effortless for a healthy body. Returning to good posture is a gradual process. Many people can work toward this most comfortably by using  various supports until they have the flexibility and strength to maintain this posture on their own. When sitting with proper posture, your ears will fall over your shoulders and your shoulders will fall over your hips. You should use the back of the chair to support your upper back. Your lower back will be in a neutral position, just slightly arched. You may place a small pillow or folded towel at the base of your lower back for  support.  When working at a desk, create an environment that supports good, upright posture. Without extra support, muscles tire, which leads to excessive strain on joints and other tissues. Keep these recommendations in mind:  CHAIR:  A chair should be able to slide under your desk when your back makes contact with the back of the chair. This allows you to work closely.  The chair's height should allow your eyes to be level with the upper part of your monitor and your hands to be slightly lower than your elbows.  BODY POSITION  Your feet should make contact with the floor. If this is not possible, use a foot rest.  Keep your ears over your shoulders. This will reduce stress on your neck and low back.  INCORRECT SITTING POSTURES  If you are feeling tired and unable to assume a healthy sitting posture, do not slouch or slump. This puts excessive strain on your back tissues, causing more damage and pain. Healthier options include:  Using more support, like a lumbar pillow.  Switching tasks to something that requires you to be upright or walking.  Talking a brief walk.  Lying down to rest in a neutral-spine position.  PROLONGED STANDING WHILE SLIGHTLY LEANING FORWARD  When completing a task that requires you to lean forward while standing in one place for a long time, place either foot up on a stationary 2-4 inch high object to help maintain the best posture. When both feet are on the ground, the lower back tends to lose its slight inward curve. If this curve  flattens (or becomes too large), then the back and your  other joints will experience too much stress, tire more quickly, and can cause pain.  CORRECT STANDING POSTURES Proper standing posture should be assumed with all daily activities, even if they only take a few moments, like when brushing your teeth. As in sitting, your ears should fall over your shoulders and your shoulders should fall over your hips. You should keep a slight tension in your abdominal muscles to brace your spine. Your tailbone should point down to the ground, not behind your body, resulting in an over-extended swayback posture.   INCORRECT STANDING POSTURES  Common incorrect standing postures include a forward head, locked knees and/or an excessive swayback. WALKING Walk with an upright posture. Your ears, shoulders and hips should all line-up.  PROLONGED ACTIVITY IN A FLEXED POSITION When completing a task that requires you to bend forward at your waist or lean over a low surface, try to find a way to stabilize 3 out of 4 of your limbs. You can place a hand or elbow on your thigh or rest a knee on the surface you are reaching across. This will provide you more stability, so that your muscles do not tire as quickly. By keeping your knees relaxed, or slightly bent, you will also reduce stress across your lower back. CORRECT LIFTING TECHNIQUES  DO :  Assume a wide stance. This will provide you more stability and the opportunity to get as close as possible to the object which you are lifting.  Tense your abdominals to brace your spine. Bend at the knees and hips. Keeping your back locked in a neutral-spine position, lift using your leg muscles. Lift with your legs, keeping your back straight.  Test the weight of unknown objects before attempting to lift them.  Try to keep your elbows locked down at your sides in order get the best strength from your shoulders when carrying an object.     Always ask for help when lifting  heavy or awkward objects. INCORRECT LIFTING TECHNIQUES DO NOT:   Lock your knees when lifting, even if it is a small object.  Bend and twist. Pivot at your feet or move your feet when needing to change directions.  Assume that you can safely pick up even a paperclip without proper posture.

## 2019-03-09 ENCOUNTER — Other Ambulatory Visit: Payer: Self-pay | Admitting: Family Medicine

## 2019-03-09 ENCOUNTER — Other Ambulatory Visit: Payer: Self-pay | Admitting: *Deleted

## 2019-03-09 MED ORDER — TESTOSTERONE 30 MG/ACT TD SOLN: 60.0000 mg | Freq: Every day | TRANSDERMAL | 0 refills | Status: DC

## 2019-03-09 NOTE — Telephone Encounter (Signed)
I sent in a one month supply of his testosterone but I have not seen him since may and I have to have contact with him and check labs every 6 months to make sure he is getting the right dose still. Please arrange a quick virtual visit maybe and then we can decide on lab work

## 2019-03-09 NOTE — Telephone Encounter (Signed)
Received fax from [facility] requesting refill of Testosterone 30mg /ACT solution. Place 60mg  on the skin every day.

## 2019-03-10 NOTE — Telephone Encounter (Signed)
Please schedule patient for medication follow up VV with pcp

## 2019-03-10 NOTE — Telephone Encounter (Signed)
LM to schedule virtual visit w/in 30 days

## 2019-04-13 NOTE — Telephone Encounter (Signed)
Mychart message from 03/29/19 came back as unread. Mailed letter to pt.

## 2019-07-26 ENCOUNTER — Other Ambulatory Visit: Payer: Self-pay | Admitting: Family Medicine

## 2019-07-26 DIAGNOSIS — I10 Essential (primary) hypertension: Secondary | ICD-10-CM

## 2019-07-26 MED ORDER — LISINOPRIL 20 MG PO TABS: 20.0000 mg | ORAL_TABLET | Freq: Two times a day (BID) | ORAL | 0 refills | Status: DC

## 2019-10-05 ENCOUNTER — Other Ambulatory Visit: Payer: Self-pay | Admitting: Family Medicine

## 2019-10-05 DIAGNOSIS — I10 Essential (primary) hypertension: Secondary | ICD-10-CM

## 2020-03-28 ENCOUNTER — Other Ambulatory Visit: Payer: Self-pay | Admitting: Family Medicine

## 2020-03-28 DIAGNOSIS — I10 Essential (primary) hypertension: Secondary | ICD-10-CM

## 2020-05-28 ENCOUNTER — Other Ambulatory Visit: Payer: Self-pay | Admitting: Family Medicine

## 2020-05-28 DIAGNOSIS — I10 Essential (primary) hypertension: Secondary | ICD-10-CM

## 2020-06-01 ENCOUNTER — Encounter: Payer: Self-pay | Admitting: Family Medicine

## 2020-06-01 ENCOUNTER — Other Ambulatory Visit: Payer: Self-pay | Admitting: Family Medicine

## 2020-06-01 DIAGNOSIS — I10 Essential (primary) hypertension: Secondary | ICD-10-CM

## 2020-06-01 MED ORDER — LISINOPRIL 20 MG PO TABS: 20.0000 mg | ORAL_TABLET | Freq: Every day | ORAL | 0 refills | Status: DC

## 2020-06-01 MED ORDER — LISINOPRIL 20 MG PO TABS: 20.0000 mg | ORAL_TABLET | Freq: Two times a day (BID) | ORAL | 0 refills | Status: AC

## 2020-08-30 ENCOUNTER — Ambulatory Visit: Payer: Self-pay | Admitting: Family Medicine

## 2020-10-24 ENCOUNTER — Other Ambulatory Visit: Payer: Self-pay | Admitting: Family Medicine

## 2020-10-24 DIAGNOSIS — I10 Essential (primary) hypertension: Secondary | ICD-10-CM

## 2021-01-06 ENCOUNTER — Other Ambulatory Visit: Payer: Self-pay | Admitting: Family Medicine

## 2021-01-06 DIAGNOSIS — I10 Essential (primary) hypertension: Secondary | ICD-10-CM

## 2022-08-26 DIAGNOSIS — H2513 Age-related nuclear cataract, bilateral: Secondary | ICD-10-CM | POA: Diagnosis not present

## 2022-08-26 DIAGNOSIS — E119 Type 2 diabetes mellitus without complications: Secondary | ICD-10-CM | POA: Diagnosis not present

## 2022-09-01 DIAGNOSIS — E113213 Type 2 diabetes mellitus with mild nonproliferative diabetic retinopathy with macular edema, bilateral: Secondary | ICD-10-CM | POA: Diagnosis not present

## 2022-09-01 DIAGNOSIS — H35033 Hypertensive retinopathy, bilateral: Secondary | ICD-10-CM | POA: Diagnosis not present

## 2022-09-01 DIAGNOSIS — H43823 Vitreomacular adhesion, bilateral: Secondary | ICD-10-CM | POA: Diagnosis not present

## 2022-12-30 ENCOUNTER — Ambulatory Visit
Admission: EM | Admit: 2022-12-30 | Discharge: 2022-12-30 | Disposition: A | Discharge status: Home / Self Care | Payer: BC Managed Care – PPO | Attending: Internal Medicine | Admitting: Internal Medicine

## 2022-12-30 DIAGNOSIS — N3001 Acute cystitis with hematuria: Secondary | ICD-10-CM | POA: Diagnosis not present

## 2022-12-30 DIAGNOSIS — R300 Dysuria: Secondary | ICD-10-CM | POA: Diagnosis not present

## 2022-12-30 LAB — POCT URINALYSIS DIP (MANUAL ENTRY)
Bilirubin, UA: NEGATIVE
Glucose, UA: >=1000 mg/dL — AB
Ketones, POC UA: NEGATIVE mg/dL
Nitrite, UA: POSITIVE — AB
Protein Ur, POC: 30 mg/dL — AB
Spec Grav, UA: <=1.005 — AB (ref 1.010–1.025)
Urobilinogen, UA: 0.2 U/dL
pH, UA: 5.5 (ref 5.0–8.0)

## 2022-12-30 MED ORDER — CIPROFLOXACIN HCL 500 MG PO TABS: 500.0000 mg | ORAL_TABLET | Freq: Two times a day (BID) | ORAL | 0 refills | Status: AC

## 2022-12-30 MED ORDER — ONDANSETRON HCL 4 MG PO TABS: 4.0000 mg | ORAL_TABLET | Freq: Three times a day (TID) | ORAL | 0 refills | Status: DC | PRN

## 2022-12-30 MED ORDER — CEFTRIAXONE SODIUM 1 G IJ SOLR
1.0000 g | Freq: Once | INTRAMUSCULAR | Status: AC
  Administered 2022-12-30: 1 g via INTRAMUSCULAR

## 2022-12-30 MED ORDER — KETOROLAC TROMETHAMINE 30 MG/ML IJ SOLN
30.0000 mg | Freq: Once | INTRAMUSCULAR | Status: AC
  Administered 2022-12-30: 30 mg via INTRAMUSCULAR

## 2022-12-30 NOTE — ED Triage Notes (Signed)
Pt c/o left sided flank pain starting Saturday, burning with urination, F Tmax 100.0, N with pain. Denies V/D Hx of kidney stones. Home remedies: AZO
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
## 2022-12-30 NOTE — Discharge Instructions (Addendum)
Your urine sample was consistent with a urinary tract infection. You were prescribed Ciprofloxacin which is an antibiotic that is often used to treat urinary tract infections. Take the prescription as directed. A urine culture has been sent to the lab for further testing.  We will call you with those results.  If the prescription needs to be changed, it will be done so at that time.   You have been given two injections today in office. One is Toradol which is an anti-inflammatory used to help with pain. Avoid NSAIDs for the rest of the day given your injection today in office. You were also given a Rocephin shot today which is an antibiotic.   Return in 2 to 3 days if no improvement. Please go directly to the Emergency Department immediately should you begin to have any of the following symptoms: persistent fevers, increased pain or persistent nausea/vomiting.

## 2022-12-31 LAB — CBC WITH DIFFERENTIAL/PLATELET
Basophils Absolute: 0.1 10*3/uL (ref 0.0–0.2)
Basos: 1 %
EOS (ABSOLUTE): 0.5 10*3/uL — ABNORMAL HIGH (ref 0.0–0.4)
Eos: 4 %
Hematocrit: 48 % (ref 37.5–51.0)
Hemoglobin: 15.9 g/dL (ref 13.0–17.7)
Immature Grans (Abs): 0.1 10*3/uL (ref 0.0–0.1)
Immature Granulocytes: 1 %
Lymphocytes Absolute: 2.5 10*3/uL (ref 0.7–3.1)
Lymphs: 17 %
MCH: 29.2 pg (ref 26.6–33.0)
MCHC: 33.1 g/dL (ref 31.5–35.7)
MCV: 88 fL (ref 79–97)
Monocytes Absolute: 1.3 10*3/uL — ABNORMAL HIGH (ref 0.1–0.9)
Monocytes: 9 %
Neutrophils Absolute: 10 10*3/uL — ABNORMAL HIGH (ref 1.4–7.0)
Neutrophils: 68 %
Platelets: 334 10*3/uL (ref 150–450)
RBC: 5.45 x10E6/uL (ref 4.14–5.80)
RDW: 11.9 % (ref 11.6–15.4)
WBC: 14.6 10*3/uL — ABNORMAL HIGH (ref 3.4–10.8)

## 2022-12-31 LAB — COMPREHENSIVE METABOLIC PANEL
ALT: 40 [IU]/L (ref 0–44)
AST: 25 [IU]/L (ref 0–40)
Albumin: 4.7 g/dL (ref 4.1–5.1)
Alkaline Phosphatase: 83 [IU]/L (ref 44–121)
BUN/Creatinine Ratio: 15 (ref 9–20)
BUN: 14 mg/dL (ref 6–24)
Bilirubin Total: 0.5 mg/dL (ref 0.0–1.2)
CO2: 22 mmol/L (ref 20–29)
Calcium: 9.5 mg/dL (ref 8.7–10.2)
Chloride: 99 mmol/L (ref 96–106)
Creatinine, Ser: 0.93 mg/dL (ref 0.76–1.27)
Globulin, Total: 2.7 g/dL (ref 1.5–4.5)
Glucose: 114 mg/dL — ABNORMAL HIGH (ref 70–99)
Potassium: 4.5 mmol/L (ref 3.5–5.2)
Sodium: 139 mmol/L (ref 134–144)
Total Protein: 7.4 g/dL (ref 6.0–8.5)
eGFR: 103 mL/min/{1.73_m2} (ref >59)

## 2023-01-02 LAB — URINE CULTURE: Culture: >=100000 — AB

## 2023-02-11 DIAGNOSIS — I10 Essential (primary) hypertension: Secondary | ICD-10-CM | POA: Diagnosis not present

## 2023-04-13 DIAGNOSIS — J112 Influenza due to unidentified influenza virus with gastrointestinal manifestations: Secondary | ICD-10-CM | POA: Diagnosis not present

## 2023-04-13 DIAGNOSIS — R110 Nausea: Secondary | ICD-10-CM | POA: Diagnosis not present

## 2023-04-13 DIAGNOSIS — J111 Influenza due to unidentified influenza virus with other respiratory manifestations: Secondary | ICD-10-CM | POA: Diagnosis not present

## 2023-05-25 DIAGNOSIS — J069 Acute upper respiratory infection, unspecified: Secondary | ICD-10-CM | POA: Diagnosis not present

## 2023-06-01 DIAGNOSIS — J0100 Acute maxillary sinusitis, unspecified: Secondary | ICD-10-CM | POA: Diagnosis not present

## 2023-06-30 DIAGNOSIS — E119 Type 2 diabetes mellitus without complications: Secondary | ICD-10-CM | POA: Diagnosis not present

## 2023-07-23 DIAGNOSIS — Z0189 Encounter for other specified special examinations: Secondary | ICD-10-CM | POA: Diagnosis not present

## 2023-07-24 LAB — LAB REPORT - SCANNED
A1c: 7.4
EGFR: 89
Free T4: 7 ng/dL
TSH: 1.92 (ref 0.41–5.90)

## 2023-07-27 DIAGNOSIS — I10 Essential (primary) hypertension: Secondary | ICD-10-CM | POA: Diagnosis not present

## 2023-07-27 DIAGNOSIS — E119 Type 2 diabetes mellitus without complications: Secondary | ICD-10-CM | POA: Diagnosis not present

## 2023-07-27 DIAGNOSIS — Z043 Encounter for examination and observation following other accident: Secondary | ICD-10-CM | POA: Diagnosis not present

## 2023-07-30 DIAGNOSIS — E119 Type 2 diabetes mellitus without complications: Secondary | ICD-10-CM | POA: Diagnosis not present

## 2023-08-03 DIAGNOSIS — J069 Acute upper respiratory infection, unspecified: Secondary | ICD-10-CM | POA: Diagnosis not present

## 2023-08-09 DIAGNOSIS — Z1212 Encounter for screening for malignant neoplasm of rectum: Secondary | ICD-10-CM | POA: Diagnosis not present

## 2023-08-09 DIAGNOSIS — Z1211 Encounter for screening for malignant neoplasm of colon: Secondary | ICD-10-CM | POA: Diagnosis not present

## 2023-08-09 LAB — COLOGUARD: Cologuard: NEGATIVE

## 2023-08-10 DIAGNOSIS — J0110 Acute frontal sinusitis, unspecified: Secondary | ICD-10-CM | POA: Diagnosis not present

## 2023-08-18 ENCOUNTER — Ambulatory Visit: Admitting: Adult Health

## 2023-08-18 ENCOUNTER — Encounter: Payer: Self-pay | Admitting: Adult Health

## 2023-08-18 ENCOUNTER — Ambulatory Visit (INDEPENDENT_AMBULATORY_CARE_PROVIDER_SITE_OTHER)

## 2023-08-18 VITALS — BP 122/80 | HR 91 | Temp 98.1°F | Ht 75.0 in | Wt 235.0 lb

## 2023-08-18 DIAGNOSIS — E782 Mixed hyperlipidemia: Secondary | ICD-10-CM

## 2023-08-18 DIAGNOSIS — Z7984 Long term (current) use of oral hypoglycemic drugs: Secondary | ICD-10-CM

## 2023-08-18 DIAGNOSIS — R109 Unspecified abdominal pain: Secondary | ICD-10-CM

## 2023-08-18 DIAGNOSIS — K219 Gastro-esophageal reflux disease without esophagitis: Secondary | ICD-10-CM

## 2023-08-18 DIAGNOSIS — E291 Testicular hypofunction: Secondary | ICD-10-CM | POA: Diagnosis not present

## 2023-08-18 DIAGNOSIS — Z0000 Encounter for general adult medical examination without abnormal findings: Secondary | ICD-10-CM

## 2023-08-18 DIAGNOSIS — E119 Type 2 diabetes mellitus without complications: Secondary | ICD-10-CM | POA: Diagnosis not present

## 2023-08-18 DIAGNOSIS — R0989 Other specified symptoms and signs involving the circulatory and respiratory systems: Secondary | ICD-10-CM | POA: Diagnosis not present

## 2023-08-18 DIAGNOSIS — I10 Essential (primary) hypertension: Secondary | ICD-10-CM

## 2023-08-18 MED ORDER — METFORMIN HCL 500 MG PO TABS: 1000.0000 mg | ORAL_TABLET | Freq: Two times a day (BID) | ORAL | 1 refills | Status: DC
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
## 2023-08-18 NOTE — Patient Instructions (Signed)
 It was great meeting you today   We will do an xray today   Follow up in July for diabetes check

## 2023-08-19 ENCOUNTER — Encounter: Payer: Self-pay | Admitting: Adult Health

## 2023-08-19 MED ORDER — METFORMIN HCL 500 MG PO TABS: 1000.0000 mg | ORAL_TABLET | Freq: Two times a day (BID) | ORAL | 1 refills | Status: DC

## 2023-08-20 ENCOUNTER — Ambulatory Visit: Payer: Self-pay | Admitting: Adult Health

## 2023-08-30 DIAGNOSIS — E119 Type 2 diabetes mellitus without complications: Secondary | ICD-10-CM | POA: Diagnosis not present

## 2023-08-31 ENCOUNTER — Encounter: Payer: Self-pay | Admitting: Adult Health

## 2023-09-01 MED ORDER — EMPAGLIFLOZIN 10 MG PO TABS: 10.0000 mg | ORAL_TABLET | Freq: Every day | ORAL | 1 refills | Status: DC

## 2023-09-01 NOTE — Addendum Note (Signed)
 Addended by: Twyla Galeazzi R on: 09/01/2023 04:07 PM   Modules accepted: Orders

## 2023-09-01 NOTE — Telephone Encounter (Signed)
 Look like dm is controlled with Metformin . However, Michael Gallagher is listed on pt med list. Please advise.

## 2023-09-14 ENCOUNTER — Encounter: Payer: Self-pay | Admitting: Adult Health

## 2023-09-14 NOTE — Telephone Encounter (Signed)
 FYI

## 2023-09-15 ENCOUNTER — Other Ambulatory Visit: Payer: Self-pay | Admitting: Adult Health

## 2023-09-15 DIAGNOSIS — R109 Unspecified abdominal pain: Secondary | ICD-10-CM

## 2023-09-15 NOTE — Telephone Encounter (Signed)
**Note De-identified  [place] Obfuscation** Please advise 

## 2023-09-24 ENCOUNTER — Encounter: Payer: Self-pay | Admitting: Adult Health

## 2023-09-28 ENCOUNTER — Ambulatory Visit
Admission: RE | Admit: 2023-09-28 | Discharge: 2023-09-28 | Disposition: A | Discharge status: Home / Self Care | Source: Ambulatory Visit | Attending: Adult Health | Admitting: Adult Health

## 2023-09-28 DIAGNOSIS — R109 Unspecified abdominal pain: Secondary | ICD-10-CM

## 2023-09-28 DIAGNOSIS — R1030 Lower abdominal pain, unspecified: Secondary | ICD-10-CM | POA: Diagnosis not present

## 2023-09-29 DIAGNOSIS — E119 Type 2 diabetes mellitus without complications: Secondary | ICD-10-CM | POA: Diagnosis not present

## 2023-10-06 ENCOUNTER — Ambulatory Visit: Payer: Self-pay | Admitting: Adult Health

## 2023-10-07 ENCOUNTER — Ambulatory Visit: Payer: Self-pay | Admitting: Adult Health

## 2023-10-07 ENCOUNTER — Encounter: Payer: Self-pay | Admitting: Adult Health

## 2023-10-07 ENCOUNTER — Ambulatory Visit: Admitting: Adult Health

## 2023-10-07 VITALS — BP 120/88 | HR 66 | Temp 97.7°F | Ht 75.0 in | Wt 229.0 lb

## 2023-10-07 DIAGNOSIS — Z7984 Long term (current) use of oral hypoglycemic drugs: Secondary | ICD-10-CM

## 2023-10-07 DIAGNOSIS — E782 Mixed hyperlipidemia: Secondary | ICD-10-CM

## 2023-10-07 DIAGNOSIS — I10 Essential (primary) hypertension: Secondary | ICD-10-CM

## 2023-10-07 DIAGNOSIS — R7989 Other specified abnormal findings of blood chemistry: Secondary | ICD-10-CM

## 2023-10-07 DIAGNOSIS — E291 Testicular hypofunction: Secondary | ICD-10-CM

## 2023-10-07 DIAGNOSIS — Z0000 Encounter for general adult medical examination without abnormal findings: Secondary | ICD-10-CM | POA: Diagnosis not present

## 2023-10-07 DIAGNOSIS — E119 Type 2 diabetes mellitus without complications: Secondary | ICD-10-CM

## 2023-10-07 LAB — COMPREHENSIVE METABOLIC PANEL WITH GFR
ALT: 33 U/L (ref 0–53)
AST: 22 U/L (ref 0–37)
Albumin: 4.8 g/dL (ref 3.5–5.2)
Alkaline Phosphatase: 69 U/L (ref 39–117)
BUN: 28 mg/dL — ABNORMAL HIGH (ref 6–23)
CO2: 28 meq/L (ref 19–32)
Calcium: 10 mg/dL (ref 8.4–10.5)
Chloride: 100 meq/L (ref 96–112)
Creatinine, Ser: 1.1 mg/dL (ref 0.40–1.50)
GFR: 80.68 mL/min (ref >60.00)
Glucose, Bld: 115 mg/dL — ABNORMAL HIGH (ref 70–99)
Potassium: 4.7 meq/L (ref 3.5–5.1)
Sodium: 137 meq/L (ref 135–145)
Total Bilirubin: 0.5 mg/dL (ref 0.2–1.2)
Total Protein: 7.4 g/dL (ref 6.0–8.3)

## 2023-10-07 LAB — MICROALBUMIN / CREATININE URINE RATIO
Creatinine,U: 26.2 mg/dL
Microalb Creat Ratio: UNDETERMINED mg/g (ref 0.0–30.0)
Microalb, Ur: <0.7 mg/dL

## 2023-10-07 LAB — LIPID PANEL
Cholesterol: 156 mg/dL (ref 0–200)
HDL: 35.3 mg/dL — ABNORMAL LOW (ref >39.00)
LDL Cholesterol: 84 mg/dL (ref 0–99)
NonHDL: 120.5
Total CHOL/HDL Ratio: 4
Triglycerides: 181 mg/dL — ABNORMAL HIGH (ref 0.0–149.0)
VLDL: 36.2 mg/dL (ref 0.0–40.0)

## 2023-10-07 LAB — CBC
HCT: 46.3 % (ref 39.0–52.0)
Hemoglobin: 15.3 g/dL (ref 13.0–17.0)
MCHC: 33.1 g/dL (ref 30.0–36.0)
MCV: 88.5 fl (ref 78.0–100.0)
Platelets: 284 K/uL (ref 150.0–400.0)
RBC: 5.23 Mil/uL (ref 4.22–5.81)
RDW: 13.3 % (ref 11.5–15.5)
WBC: 8.3 K/uL (ref 4.0–10.5)

## 2023-10-07 LAB — POCT GLYCOSYLATED HEMOGLOBIN (HGB A1C): Hemoglobin A1C: 5.8 % — AB (ref 4.0–5.6)

## 2023-10-07 LAB — PSA: PSA: 0.3 ng/mL (ref 0.10–4.00)

## 2023-10-07 LAB — TESTOSTERONE: Testosterone: 196.66 ng/dL — ABNORMAL LOW (ref 300.00–890.00)

## 2023-10-07 LAB — TSH: TSH: 1.91 u[IU]/mL (ref 0.35–5.50)
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
## 2023-10-07 NOTE — Patient Instructions (Signed)
 Health Maintenance Due  Topic Date Due   FOOT EXAM  Never done   OPHTHALMOLOGY EXAM  Never done   Diabetic kidney evaluation - Urine ACR  Never done   Hepatitis C Screening  Never done   Pneumococcal Vaccine 90-46 Years old (1 of 2 - PCV) Never done   Hepatitis B Vaccines (1 of 3 - 19+ 3-dose series) Never done   COVID-19 Vaccine (1 - 2024-25 season) Never done       08/18/2023    2:24 PM 02/11/2017   11:48 AM 05/01/2015    2:12 PM  Depression screen PHQ 2/9  Decreased Interest 0 0 0  Down, Depressed, Hopeless 0 0 0  PHQ - 2 Score 0 0 0

## 2023-10-21 DIAGNOSIS — E291 Testicular hypofunction: Secondary | ICD-10-CM | POA: Diagnosis not present

## 2023-10-26 DIAGNOSIS — Z0189 Encounter for other specified special examinations: Secondary | ICD-10-CM | POA: Diagnosis not present

## 2023-10-30 DIAGNOSIS — E119 Type 2 diabetes mellitus without complications: Secondary | ICD-10-CM | POA: Diagnosis not present

## 2023-11-30 DIAGNOSIS — E119 Type 2 diabetes mellitus without complications: Secondary | ICD-10-CM | POA: Diagnosis not present

## 2023-12-30 DIAGNOSIS — E119 Type 2 diabetes mellitus without complications: Secondary | ICD-10-CM | POA: Diagnosis not present

## 2024-01-12 ENCOUNTER — Ambulatory Visit (INDEPENDENT_AMBULATORY_CARE_PROVIDER_SITE_OTHER): Admitting: Adult Health

## 2024-01-12 ENCOUNTER — Encounter: Payer: Self-pay | Admitting: Adult Health

## 2024-01-12 VITALS — BP 126/84 | HR 93 | Temp 98.3°F | Ht 75.0 in | Wt 244.0 lb

## 2024-01-12 DIAGNOSIS — Z7984 Long term (current) use of oral hypoglycemic drugs: Secondary | ICD-10-CM | POA: Diagnosis not present

## 2024-01-12 DIAGNOSIS — I10 Essential (primary) hypertension: Secondary | ICD-10-CM | POA: Diagnosis not present

## 2024-01-12 DIAGNOSIS — E119 Type 2 diabetes mellitus without complications: Secondary | ICD-10-CM | POA: Diagnosis not present

## 2024-01-12 DIAGNOSIS — E291 Testicular hypofunction: Secondary | ICD-10-CM | POA: Diagnosis not present

## 2024-01-12 LAB — POCT GLYCOSYLATED HEMOGLOBIN (HGB A1C)
HbA1c POC (<> result, manual entry): 5.4 % (ref 4.0–5.6)
HbA1c, POC (controlled diabetic range): 5.4 % (ref 0.0–7.0)
HbA1c, POC (prediabetic range): 5.4 % — AB (ref 5.7–6.4)
Hemoglobin A1C: 5.4 % (ref 4.0–5.6)
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
## 2024-01-14 DIAGNOSIS — E291 Testicular hypofunction: Secondary | ICD-10-CM | POA: Diagnosis not present

## 2024-01-21 DIAGNOSIS — E291 Testicular hypofunction: Secondary | ICD-10-CM | POA: Diagnosis not present

## 2024-01-21 DIAGNOSIS — N5201 Erectile dysfunction due to arterial insufficiency: Secondary | ICD-10-CM | POA: Diagnosis not present

## 2024-01-26 DIAGNOSIS — L239 Allergic contact dermatitis, unspecified cause: Secondary | ICD-10-CM | POA: Diagnosis not present

## 2024-01-28 ENCOUNTER — Other Ambulatory Visit: Payer: Self-pay | Admitting: Adult Health

## 2024-01-30 DIAGNOSIS — E119 Type 2 diabetes mellitus without complications: Secondary | ICD-10-CM | POA: Diagnosis not present

## 2024-02-24 ENCOUNTER — Other Ambulatory Visit: Payer: Self-pay | Admitting: Adult Health

## 2024-03-14 DIAGNOSIS — J018 Other acute sinusitis: Secondary | ICD-10-CM | POA: Diagnosis not present

## 2024-07-13 ENCOUNTER — Ambulatory Visit: Admitting: Adult Health
